# Patient Record
Sex: Female | Born: 1955 | Race: Black or African American | Hispanic: No | Marital: Married | Smoking: Never smoker
Health system: Southern US, Community
[De-identification: ages and names within clinical notes are randomized; demographics above are authoritative.]

## PROBLEM LIST (undated history)

## (undated) DIAGNOSIS — E079 Disorder of thyroid, unspecified: Secondary | ICD-10-CM

## (undated) DIAGNOSIS — I10 Essential (primary) hypertension: Secondary | ICD-10-CM

## (undated) DIAGNOSIS — E785 Hyperlipidemia, unspecified: Secondary | ICD-10-CM

## (undated) DIAGNOSIS — G459 Transient cerebral ischemic attack, unspecified: Secondary | ICD-10-CM

## (undated) HISTORY — DX: Transient cerebral ischemic attack, unspecified: G45.9

## (undated) HISTORY — DX: Essential (primary) hypertension: I10

## (undated) HISTORY — DX: Hyperlipidemia, unspecified: E78.5

## (undated) HISTORY — PX: OTHER SURGICAL HISTORY: SHX169

## (undated) HISTORY — DX: Disorder of thyroid, unspecified: E07.9

---

## 1998-11-27 ENCOUNTER — Other Ambulatory Visit: Admission: RE | Admit: 1998-11-27 | Discharge: 1998-11-27 | Payer: Self-pay | Admitting: Obstetrics and Gynecology

## 2009-05-15 ENCOUNTER — Ambulatory Visit: Payer: Self-pay | Admitting: Internal Medicine

## 2009-05-15 DIAGNOSIS — E119 Type 2 diabetes mellitus without complications: Secondary | ICD-10-CM | POA: Insufficient documentation

## 2009-05-15 DIAGNOSIS — I10 Essential (primary) hypertension: Secondary | ICD-10-CM | POA: Insufficient documentation

## 2009-05-15 DIAGNOSIS — E039 Hypothyroidism, unspecified: Secondary | ICD-10-CM | POA: Insufficient documentation

## 2009-05-15 DIAGNOSIS — E785 Hyperlipidemia, unspecified: Secondary | ICD-10-CM | POA: Insufficient documentation

## 2009-05-15 DIAGNOSIS — M810 Age-related osteoporosis without current pathological fracture: Secondary | ICD-10-CM | POA: Insufficient documentation

## 2009-05-15 LAB — HM DIABETES FOOT EXAM

## 2009-05-19 ENCOUNTER — Ambulatory Visit: Payer: Self-pay | Admitting: Internal Medicine

## 2009-05-19 LAB — CONVERTED CEMR LAB
BUN: 13 mg/dL (ref 6–23)
Basophils Absolute: 0 10*3/uL (ref 0.0–0.1)
Basophils Relative: 0 % (ref 0–1)
Bilirubin, Direct: 0.1 mg/dL (ref 0.0–0.3)
CO2: 23 meq/L (ref 19–32)
Chloride: 106 meq/L (ref 96–112)
Creatinine, Urine: 32.5 mg/dL
HCT: 38.7 % (ref 36.0–46.0)
Hemoglobin: 12 g/dL (ref 12.0–15.0)
Hgb A1c MFr Bld: 6.2 % — ABNORMAL HIGH (ref 4.6–6.1)
Indirect Bilirubin: 0.2 mg/dL (ref 0.0–0.9)
MCHC: 31 g/dL (ref 30.0–36.0)
MCV: 91.7 fL (ref 78.0–100.0)
Microalb Creat Ratio: 15.4 mg/g (ref 0.0–30.0)
Monocytes Relative: 12 % (ref 3–12)
Neutro Abs: 3.6 10*3/uL (ref 1.7–7.7)
Neutrophils Relative %: 49 % (ref 43–77)
TSH: 1.291 microintl units/mL (ref 0.350–4.500)
VLDL: 10 mg/dL (ref 0–40)
WBC: 7.3 10*3/uL (ref 4.0–10.5)

## 2009-05-20 ENCOUNTER — Encounter: Payer: Self-pay | Admitting: Internal Medicine

## 2009-06-06 ENCOUNTER — Telehealth: Payer: Self-pay | Admitting: Internal Medicine

## 2009-07-14 ENCOUNTER — Ambulatory Visit: Payer: Self-pay | Admitting: Internal Medicine

## 2009-07-14 DIAGNOSIS — L259 Unspecified contact dermatitis, unspecified cause: Secondary | ICD-10-CM | POA: Insufficient documentation

## 2009-10-14 ENCOUNTER — Ambulatory Visit: Payer: Self-pay | Admitting: Internal Medicine

## 2009-10-14 LAB — CONVERTED CEMR LAB
CO2: 25 meq/L (ref 19–32)
Calcium: 9.4 mg/dL (ref 8.4–10.5)

## 2009-10-22 ENCOUNTER — Ambulatory Visit: Payer: Self-pay | Admitting: Internal Medicine

## 2009-10-22 DIAGNOSIS — J029 Acute pharyngitis, unspecified: Secondary | ICD-10-CM | POA: Insufficient documentation

## 2009-10-23 ENCOUNTER — Telehealth: Payer: Self-pay | Admitting: Internal Medicine

## 2009-10-27 ENCOUNTER — Telehealth: Payer: Self-pay | Admitting: Internal Medicine

## 2009-10-28 ENCOUNTER — Ambulatory Visit (HOSPITAL_BASED_OUTPATIENT_CLINIC_OR_DEPARTMENT_OTHER): Admission: RE | Admit: 2009-10-28 | Discharge: 2009-10-28 | Payer: Self-pay | Admitting: Internal Medicine

## 2009-10-28 ENCOUNTER — Ambulatory Visit: Payer: Self-pay | Admitting: Internal Medicine

## 2009-10-28 ENCOUNTER — Ambulatory Visit: Payer: Self-pay | Admitting: Diagnostic Radiology

## 2009-10-28 DIAGNOSIS — R059 Cough, unspecified: Secondary | ICD-10-CM | POA: Insufficient documentation

## 2009-10-28 DIAGNOSIS — R05 Cough: Secondary | ICD-10-CM | POA: Insufficient documentation

## 2009-10-28 DIAGNOSIS — J189 Pneumonia, unspecified organism: Secondary | ICD-10-CM | POA: Insufficient documentation

## 2009-10-29 ENCOUNTER — Telehealth: Payer: Self-pay | Admitting: Internal Medicine

## 2009-10-31 ENCOUNTER — Telehealth: Payer: Self-pay | Admitting: Internal Medicine

## 2009-11-10 ENCOUNTER — Ambulatory Visit (HOSPITAL_BASED_OUTPATIENT_CLINIC_OR_DEPARTMENT_OTHER): Admission: RE | Admit: 2009-11-10 | Discharge: 2009-11-10 | Payer: Self-pay | Admitting: Internal Medicine

## 2009-11-10 ENCOUNTER — Ambulatory Visit: Payer: Self-pay | Admitting: Internal Medicine

## 2009-11-10 ENCOUNTER — Ambulatory Visit: Payer: Self-pay | Admitting: Diagnostic Radiology

## 2009-12-09 ENCOUNTER — Ambulatory Visit: Payer: Self-pay | Admitting: Internal Medicine

## 2010-04-01 ENCOUNTER — Encounter: Payer: Self-pay | Admitting: Internal Medicine

## 2010-04-01 ENCOUNTER — Ambulatory Visit: Payer: Self-pay | Admitting: Internal Medicine

## 2010-04-01 LAB — CONVERTED CEMR LAB
BUN: 10 mg/dL (ref 6–23)
CO2: 25 meq/L (ref 19–32)
Calcium: 9.2 mg/dL (ref 8.4–10.5)
Creatinine, Urine: 63.7 mg/dL
Microalb Creat Ratio: 7.8 mg/g (ref 0.0–30.0)
Microalb, Ur: 0.5 mg/dL (ref 0.00–1.89)
Potassium: 4.2 meq/L (ref 3.5–5.3)

## 2010-04-08 ENCOUNTER — Ambulatory Visit: Payer: Self-pay | Admitting: Internal Medicine

## 2010-04-13 ENCOUNTER — Encounter: Payer: Self-pay | Admitting: Internal Medicine

## 2010-06-11 ENCOUNTER — Encounter: Payer: Self-pay | Admitting: Internal Medicine

## 2010-06-12 ENCOUNTER — Telehealth: Payer: Self-pay | Admitting: Internal Medicine

## 2010-06-12 LAB — CONVERTED CEMR LAB
Bilirubin, Direct: 0.1 mg/dL (ref 0.0–0.3)
Cholesterol: 134 mg/dL (ref 0–200)
LDL Cholesterol: 65 mg/dL (ref 0–99)
Total Bilirubin: 0.4 mg/dL (ref 0.3–1.2)
Total CHOL/HDL Ratio: 2.4
Triglycerides: 61 mg/dL (ref <150)
VLDL: 12 mg/dL (ref 0–40)

## 2010-06-18 ENCOUNTER — Encounter: Payer: Self-pay | Admitting: Internal Medicine

## 2010-08-06 ENCOUNTER — Ambulatory Visit: Payer: Self-pay | Admitting: Internal Medicine

## 2010-08-06 DIAGNOSIS — R209 Unspecified disturbances of skin sensation: Secondary | ICD-10-CM | POA: Insufficient documentation

## 2010-08-06 LAB — CONVERTED CEMR LAB
Calcium: 9.5 mg/dL (ref 8.4–10.5)
Chloride: 106 meq/L (ref 96–112)
Creatinine, Ser: 0.65 mg/dL (ref 0.40–1.20)
TSH: 1.25 microintl units/mL (ref 0.350–4.500)

## 2010-08-07 ENCOUNTER — Encounter (INDEPENDENT_AMBULATORY_CARE_PROVIDER_SITE_OTHER): Payer: Self-pay | Admitting: *Deleted

## 2010-08-07 ENCOUNTER — Encounter: Payer: Self-pay | Admitting: Internal Medicine

## 2010-08-24 ENCOUNTER — Telehealth: Payer: Self-pay | Admitting: Internal Medicine

## 2010-09-15 NOTE — Miscellaneous (Signed)
Summary: Orders Update  Clinical Lists Changes  Orders: Added new Test order of T-Hepatic Function [phone]) - Signed Added new Test order of T-Lipid Profile [id number]) - Signed Added new Test order of T-TSH [phone]) - Signed

## 2010-09-15 NOTE — Assessment & Plan Note (Signed)
Summary: cpx/mhf pt out of town/dt   Vital Signs:  Patient profile:   55 year old female Weight:      233.50 pounds BMI:     37.82 O2 Sat:      99 % on Room air Temp:     98.0 degrees F oral Pulse rate:   70 / minute Pulse rhythm:   regular Resp:     18 per minute BP sitting:   112 / 80  (right arm) Cuff size:   large  Vitals Entered By: Glendell Docker CMA (April 08, 2010 8:37 AM)  O2 Flow:  Room air CC: CPX  Is Patient Diabetic? Yes Pain Assessment Patient in pain? no       Does patient need assistance? Functional Status Self care Ambulation Normal   Primary Care Provider:  Dondra Spry DO  CC:  CPX .  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   it is difficult to cook for herself  Preventive Screening-Counseling & Management  Alcohol-Tobacco     Alcohol drinks/day: <1     Smoking Status: never  Caffeine-Diet-Exercise     Caffeine use/day: None     Does Patient Exercise: no  Allergies: 1)  ! Sulfa 2)  Ramipril (Ramipril)  Past History:  Past Medical History: Diabetes mellitus, type II Hyperlipidemia  Hypertension Hypothyroidism  Osteoporosis       Past Surgical History: GYN - Dr. Rosalio Macadamia    Family History: Family History High cholesterol Family History Hypertension No fam hx of cancer      Social History: Occupation: Software engineer Married 27  years  1 son 53   1 daughter 67        husband - Interior and spatial designer of transportation in [place] Lives in [place] use/day:  None  Physical Exam  General:  alert, well-developed, and well-nourished.   Neck:  No deformities, masses, or tenderness noted.no carotid bruits.   Lungs:  normal respiratory effort, normal breath sounds, no crackles, and  no wheezes.   Heart:  normal rate, regular rhythm, and no gallop.   Abdomen:  soft, non-tender, normal bowel sounds, no hepatomegaly, and no splenomegaly.   Extremities:  No lower extremity edema  Neurologic:  cranial nerves II-XII intact and gait normal.   Psych:  normally interactive, good eye contact, not anxious appearing, and not depressed appearing.     Impression & Recommendations:  Problem # 1:  HEALTH MAINTENANCE EXAM (ICD-V70.0) Reviewed adult health maintenance protocols. PAP and Pelvic - follow up with her GYN Pt counseled on diet and exercise.  Orders: Mammogram (Screening) (Mammo)  Flu Vax: Fluvax Non-MCR (05/15/2009)   Pneumovax: Pneumovax (07/14/2009) Chol: 128 (05/19/2009)   HDL: 50 (05/19/2009)   LDL: 68 (05/19/2009)   TG: 49 (05/19/2009) TSH: 1.291 (05/19/2009)   HgbA1C: 6.4 (04/01/2010)     Complete Medication List: 1)  Synthroid 88 Mcg Tabs (Levothyroxine sodium) .... Take 1 tablet by mouth once a day 2)  Metformin Hcl 500 Mg Tabs (Metformin hcl) .... 2 tabs by mouth two times a day 3)  Multivitamins Tabs (Multiple vitamin) .... Take 1 tablet by mouth once a day 4)  Aspirin Low Dose 81 Mg Tabs (Aspirin) .... Take 1 tablet by  mouth once a day every evening 5)  Simvastatin 40 Mg Tabs (Simvastatin) .... Take 1 tab by mouth at bedtime 6)  Alendronate Sodium 70 Mg Tabs (Alendronate sodium) .... One by mouth qwkly 7)  Januvia 100 Mg Tabs (Sitagliptin phosphate) .... Take 1 tablet by mouth once a day every evening 8)  Diovan 160 Mg Tabs (Valsartan) .... One by mouth once daily 9)  Vitamin D3 1000 Unit Caps (Cholecalciferol) .... One by mouth once daily  Patient Instructions: 1)  Hepatic Panel prior to visit, ICD-9:  272.4 2)  Lipid Panel prior to visit, ICD-9: 272.4 3)  TSH prior to visit, ICD-9: 244.9 4)  Please schedule a follow-up appointment in 4 months. 5)  Please return for fasting lab work in October, 2011 Prescriptions: DIOVAN 160 MG TABS (VALSARTAN) one  by mouth once daily  #90 x 3   Entered and Authorized by:   D. Thomos Lemons DO   Signed by:   D. Thomos Lemons DO on 04/08/2010   Method used:   Electronically to        [facility]* (retail)             ,          Ph: [phone]       Fax: [phone]   RxID:   [id number] METFORMIN HCL 500 MG TABS (METFORMIN HCL) 2 tabs by mouth two times a day  #360 x 3   Entered and Authorized by:   D. Thomos Lemons DO   Signed by:   D. Thomos Lemons DO on 04/08/2010   Method used:   Print then Mail to Patient   RxID:   [id number] SYNTHROID 88 MCG TABS (LEVOTHYROXINE SODIUM) Take 1 tablet by mouth once a day  #90 x 3   Entered and Authorized by:   D. Thomos Lemons DO   Signed by:   D. Thomos Lemons DO on 04/08/2010   Method used:   Print then Mail to Patient   RxID:   [id number] SIMVASTATIN 40 MG TABS (SIMVASTATIN) Take 1 tab by mouth at bedtime  #90 Tablet x 3   Entered and Authorized by:   D. Thomos Lemons DO   Signed by:   D. Thomos Lemons DO on 04/08/2010   Method used:   Print then Mail to Patient   RxID:   [id number] JANUVIA 100 MG TABS (SITAGLIPTIN PHOSPHATE) Take 1 tablet by mouth once a day every evening  #90 x 3   Entered and Authorized by:   D. Thomos Lemons DO   Signed by:   D. Thomos Lemons DO on 04/08/2010   Method used:   Print then Mail to Patient   RxID:   [id number]   Current Allergies (reviewed today): ! SULFA RAMIPRIL (RAMIPRIL)       Prevention & Chronic Care Immunizations   Influenza vaccine: Fluvax Non-MCR  (05/15/2009)    Tetanus booster: Not documented    Pneumococcal vaccine: Pneumovax  (07/14/2009)  Colorectal Screening   Hemoccult: Not documented    Colonoscopy: Not documented  Other Screening   Pap smear: Not documented    Mammogram: Not documented   Smoking status: never  (04/08/2010)  Diabetes Mellitus   HgbA1C: 6.4  (04/01/2010)    Eye exam: normal  (02/24/2009)   Eye exam due: 02/2010    Foot exam: yes  (05/15/2009)   High  risk foot: Not documented   Foot care education: Not documented    Urine microalbumin/creatinine ratio: 7.8  (  04/01/2010)  Lipids   Total Cholesterol: 128  (05/19/2009)   LDL: 68  (05/19/2009)   LDL Direct: Not documented   HDL: 50  (05/19/2009)   Triglycerides: 49  (05/19/2009)    SGOT (AST): 24  (05/19/2009)   SGPT (ALT): 16  (05/19/2009)   Alkaline phosphatase: 70  (05/19/2009)   Total bilirubin: 0.3  (05/19/2009)  Hypertension   Last Blood Pressure: 112 / 80  (04/08/2010)   Serum creatinine: 0.67  (04/01/2010)   Serum potassium 4.2  (04/01/2010)  Self-Management Support :    Diabetes self-management support: Not documented    Hypertension self-management support: Not documented    Lipid self-management support: Not documented

## 2010-09-15 NOTE — Miscellaneous (Signed)
Summary: BONE DENSITY  Clinical Lists Changes  Orders: Added new Test order of T-Bone Densitometry (77080) - Signed Added new Test order of T-Lumbar Vertebral Assessment (77082) - Signed 

## 2010-09-15 NOTE — Assessment & Plan Note (Signed)
Summary: ST  /?STREP/HEA   Vital Signs:  Patient profile:   55 year old female Weight:      228.50 pounds O2 Sat:      97 % on Room air Temp:     98.3 degrees F oral Pulse rate:   88 / minute Pulse rhythm:   regular Resp:     18 per minute BP sitting:   150 / 90  (right arm) Cuff size:   large  Vitals Entered By: Glendell Docker CMA (October 22, 2009 10:39 AM)  O2 Flow:  Room air CC: Rm 2- sore throat, URI symptoms Is Patient Diabetic? Yes Comments c/o sore throat, pain with swallowing, no temp, dry cough sudden onset yesterday   Primary Care Provider:  DThomos Lemons DO  CC:  Rm 2- sore throat and URI symptoms.  History of Present Illness:  URI Symptoms      This is a 55 year old woman who presents with URI symptoms.  The patient reports sore throat and dry cough.  The patient denies fever.  The patient denies muscle aches and severe fatigue.  onset - 24-48 hrs  mother from [place] living with her temporarily - rehab after surgery  Allergies: 1)  ! 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   Neck:  No deformities, masses, or tenderness noted.no carotid bruits.   Lungs:  normal respiratory effort, normal breath sounds, no crackles, and no wheezes.   Heart:  normal rate, regular rhythm, and no gallop.     Impression & Recommendations:  Problem # 1:  PHARYNGITIS, ACUTE (ICD-462) 55 y/o with acute pharyngitis x 24 -48 hrs. rapid strep is negative.  she likely has viral URI.  continue salt water gargles.  we discussed symptoms of secondary sinusitis or bronchitis.  rx for ceftin provided with intstructions  to start if symptoms go beyond 7-10 days, fever, or purulent nasal discharge.  Her updated medication list for this problem includes:    Aspirin Low Dose 81 Mg Tabs (Aspirin) .Marland Kitchen... Take 1 tablet by mouth once a day every evening    Cefuroxime Axetil 500 Mg Tabs (Cefuroxime axetil) ..... One by mouth two times a day  Complete Medication List: 1)  Synthroid 88 Mcg Tabs (Levothyroxine sodium) .... Take 1 tablet by mouth once a day 2)  Calcium 500 Mg Tabs (Calcium carbonate) .... Take 1 tablet by mouth two times a day 3)  Metformin Hcl 500 Mg Tabs (Metformin hcl) .Marland Kitchen.. 1 by mouth  every morning and 2 by mouth at lunch and 2 by mouth at dinner (2500) 4)  Multivitamins Tabs (Multiple vitamin) .... Take 1 tablet by mouth once a day 5)  Aspirin Low Dose 81 Mg Tabs (Aspirin) .... Take 1 tablet by mouth once a day every evening 6)  Simvastatin 40 Mg Tabs (Simvastatin) .... Take 1 tab by mouth at bedtime 7)  Ramipril 10 Mg Caps (Ramipril) .... Take 1 capsule by mouth at bedtime 8)  Actonel 150 Mg Tabs (Risedronate sodium) .... One tablet by mouth once a month 9)  Januvia 100 Mg Tabs (Sitagliptin phosphate) .... Take 1 tablet  by mouth once a day every evening 10)  Triamcinolone Acetonide 0.1 % Crea (Triamcinolone acetonide) .... Apply two times a day as directed 11)  Cefuroxime Axetil 500 Mg Tabs (Cefuroxime axetil) .... One by mouth two times a day 12)  Tessalon Perles 100 Mg Caps (Benzonatate) .... One by mouth three times a day as needed for cough  Other Orders: Rapid Strep (29562)  Patient Instructions: 1)  Call our office if your symptoms do not  improve or gets worse. 2)  Please schedule a follow-up appointment in 4 months. 3)  BMP prior to visit, ICD-9:  250.00 4)  HbgA1C prior to visit, ICD-9: 250.00 5)  Please return for lab work one (1) week before your next appointment.  Prescriptions: TESSALON PERLES 100 MG CAPS (BENZONATATE) one by mouth three times a day as needed for cough  #21 x 0    Entered and Authorized by:   D. Thomos Lemons DO   Signed by:   D. Thomos Lemons DO on 10/22/2009   Method used:   Print then Give to Patient   RxID:   [id number] CEFUROXIME AXETIL 500 MG TABS (CEFUROXIME AXETIL) one by mouth two times a day  #14 x 0   Entered and Authorized by:   D. Thomos Lemons DO   Signed by:   D. Thomos Lemons DO on 10/22/2009   Method used:   Print then Give to Patient   RxID:   [id number] ACTONEL 150 MG TABS (RISEDRONATE SODIUM) one tablet by mouth once a month  #3 x 3   Entered by:   Glendell Docker CMA   Authorized by:   D. Thomos Lemons DO   Signed by:   Glendell Docker CMA on 10/22/2009   Method used:   Electronically to        [facility]* (mail-order)             ,          Ph: [phone]       Fax: [phone]   RxID:   [id number]   Current Allergies (reviewed today): ! SULFA      Laboratory Results    Other Tests  Rapid Strep: negative

## 2010-09-15 NOTE — Assessment & Plan Note (Signed)
Summary: UNRESOLVED COUGH/DK, resched- jr   Vital Signs:  Patient profile:   55 year old [place] O2 Sat:      98 % on Room air Temp:     98.8 degrees F Pulse rate:   95 / minute Pulse rhythm:   regular Resp:     20 per minute BP sitting:   124 / 80  (right arm) Cuff size:   large  Vitals Entered By: Glendell Docker CMA (October 28, 2009 10:53 AM)  O2 Flow:  Room air CC: Rm -3 unresolved Cough   Primary Care Provider:  DThomos Lemons DO  CC:  Rm -3 unresolved Cough.  History of Present Illness: [place] c/o persistent cough.  cough is prod of green sputum.  some chest tightness she took ceftin as directed.  Allergies: 1)  ! 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   Lungs:  normal respiratory effort. coarse breath sounds right lung field  Heart:  normal rate, regular rhythm, and no gallop.   Extremities:  No lower extremity edema    Impression & Recommendations:  Problem # 1:  PNEUMONIA (ICD-486) 55 y/o with refractory cough.  she failed ceftin.  CXR shows Infiltrative densities are seen in the right perihilar region and right midlung zone with some nodularity on the PA image.  On the lateral image there is seen to be element of atelectasis involving the right perihilar region and the anterior inferior aspect of theright upper lobe.   This most likely reflects bronchopneumonia.  switch to avelox.  Patient advised to call office if symptoms persist or worsen.  f/u CXR in 1 month  The following medications were removed from the medication list:    Cefuroxime Axetil 500 Mg Tabs  (Cefuroxime axetil) ..... One by mouth two times a day Her updated medication list for this problem includes:    Avelox 400 Mg Tabs (Moxifloxacin hcl) ..... One by mouth qd  Complete Medication List: 1)  Synthroid 88 Mcg Tabs (Levothyroxine sodium) .... Take 1 tablet by mouth once a day 2)  Calcium 500 Mg Tabs (Calcium carbonate) .... Take 1 tablet by mouth two times a day 3)  Metformin Hcl 500 Mg Tabs (Metformin hcl) .Marland Kitchen.. 1 by mouth  every morning and 2 by mouth at lunch and 2 by mouth at dinner (2500) 4)  Multivitamins Tabs (Multiple vitamin) .... Take 1 tablet by mouth once a day 5)  Aspirin Low Dose 81 Mg Tabs (Aspirin) .... Take 1 tablet by mouth once a day every evening 6)  Simvastatin 40 Mg Tabs (Simvastatin) .... Take 1 tab by mouth at bedtime 7)  Ramipril 10 Mg Caps (Ramipril) .... Take 1 capsule by mouth at bedtime 8)  Actonel 150 Mg Tabs (Risedronate sodium) .... One tablet by mouth once a month 9)  Januvia 100 Mg Tabs (Sitagliptin phosphate) .... Take 1 tablet by mouth once a day every evening 10)  Triamcinolone Acetonide 0.1 % Crea (Triamcinolone acetonide) .... Apply two times a day as directed 11)  Tessalon Perles 100 Mg Caps (Benzonatate) .... One by mouth three times  a day as needed for cough 12)  Avelox 400 Mg Tabs (Moxifloxacin hcl) .... One by mouth qd 13)  Hydrocodone-homatropine 5-1.5 Mg/28ml Syrp (Hydrocodone-homatropine) .... 5 ml at bedtime prn  Other Orders: T-2 View CXR, Same Day (71020.5TC)  Patient Instructions: 1)  Please schedule a follow-up appointment in 1 month. Prescriptions: HYDROCODONE-HOMATROPINE 5-1.5 MG/5ML SYRP (HYDROCODONE-HOMATROPINE) 5 ml at bedtime prn  #60 ml x 0   Entered and Authorized by:   D. Thomos Lemons DO   Signed by:   D. Thomos Lemons DO on 10/28/2009   Method used:   Print then Give to Patient   RxID:   [id number] AVELOX 400 MG TABS (MOXIFLOXACIN HCL) one by mouth qd  #10 x 0   Entered and Authorized by:   D. Thomos Lemons DO    Signed by:   D. Thomos Lemons DO on 10/28/2009   Method used:   Electronically to        [facility]  [address] [phone]* (retail)       [address]       [place], [place]  [address]       Ph: [phone] or [phone]       Fax: [phone]   RxID:   [id number]   Current Allergies (reviewed today): ! SULFA

## 2010-09-15 NOTE — Assessment & Plan Note (Signed)
Summary: PNEUMONIA/MHF   Vital Signs:  Patient profile:   55 year old female Height:      66 inches Weight:      226.50 pounds BMI:     36.69 O2 Sat:      100 % on Room air Temp:     98.1 degrees F oral Pulse rate:   78 / minute Pulse rhythm:   regular Resp:     18 per minute BP sitting:   140 / 80  (right arm) Cuff size:   large  Vitals Entered By: Glendell Docker CMA (November 10, 2009 3:14 PM)  O2 Flow:  Room air CC: Rm 2- unresolved Cough Comments c/o unresolved coughing spasms with vomiting, headache x 2 days , green in color    Primary Care Provider:  Dondra Spry DO  CC:  Rm 2- unresolved Cough.  History of Present Illness: Pt seen middle of March for persistent / refractory cough.   she was diagnosed with right sided pna.  she completed course of avelox.  she felt better.  mild dry cough.    yest she again developed significant cough to the point of throwing up mucus varies from clear to greenish in color no sinus congestion no fever chills,  no SOB  Allergies: 1)  ! 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   Lungs:  normal respiratory effort, normal breath sounds, no crackles, and no wheezes.   Heart:  normal rate, regular rhythm, and no gallop.     Impression & Recommendations:  Problem # 1:  PNEUMONIA (ICD-486) Assessment Improved Pt with refractory cough.  CXR shows clearing of pna.  I suspect cough multifactorial -  post nasal gtt/rhinosinusitis and ACE cough The following medications were removed from the medication list:    Avelox 400 Mg Tabs  (Moxifloxacin hcl) ..... One by mouth qd Her updated medication list for this problem includes:    Cefuroxime Axetil 500 Mg Tabs (Cefuroxime axetil) ..... One by mouth two times a day  Orders: T-2 View CXR (71020TC)  Problem # 2:  HYPERTENSION (ICD-401.9) Pt with chronic cough.  DC ACE.  change to diovan.  The following medications were removed from the medication list:    Ramipril 10 Mg Caps (Ramipril) .Marland Kitchen... Take 1 capsule by mouth at bedtime Her updated medication list for this problem includes:    Diovan 160 Mg Tabs (Valsartan) ..... One by mouth once daily  Problem # 3:  OSTEOPOROSIS (ICD-733.00) Change to alendronate.   actonel cost prohibitive.  no GI side effect wtih oral bisphosphonates.   she is due to f/u DEXA - arrange at [facility] Her updated medication list for this problem includes:    Alendronate Sodium 70 Mg Tabs (Alendronate sodium) ..... One by mouth qwkly  Problem # 4:  DIABETES MELLITUS, TYPE II (ICD-250.00) simplify metformin regimen.   she is motivated to start exercise program.  her goal is to try to get off oral agents if possible   The following medications were removed from the medication list:    Ramipril 10  Mg Caps (Ramipril) .Marland Kitchen... Take 1 capsule by mouth at bedtime Her updated medication list for this problem includes:    Metformin Hcl 500 Mg Tabs (Metformin hcl) .Marland Kitchen... 2 tabs by mouth two times a day    Aspirin Low Dose 81 Mg Tabs (Aspirin) .Marland Kitchen... Take 1 tablet by mouth once a day every evening    Januvia 100 Mg Tabs (Sitagliptin phosphate) .Marland Kitchen... Take 1 tablet by mouth once a day every evening    Diovan 160 Mg Tabs (Valsartan) ..... One by mouth once daily  Labs Reviewed: Creat: 0.62 (10/14/2009)     Last Eye Exam: normal (02/24/2009) Reviewed HgBA1c results: 6.7 (10/14/2009)  6.2 (05/19/2009)  Complete Medication List: 1)  Synthroid 88 Mcg Tabs (Levothyroxine sodium) .... Take 1 tablet by mouth once a day 2)  Calcium 500 Mg Tabs (Calcium carbonate)  .... Take 1 tablet by mouth two times a day 3)  Metformin Hcl 500 Mg Tabs (Metformin hcl) .... 2 tabs by mouth two times a day 4)  Multivitamins Tabs (Multiple vitamin) .... Take 1 tablet by mouth once a day 5)  Aspirin Low Dose 81 Mg Tabs (Aspirin) .... Take 1 tablet by mouth once a day every evening 6)  Simvastatin 40 Mg Tabs (Simvastatin) .... Take 1 tab by mouth at bedtime 7)  Alendronate Sodium 70 Mg Tabs (Alendronate sodium) .... One by mouth qwkly 8)  Januvia 100 Mg Tabs (Sitagliptin phosphate) .... Take 1 tablet by mouth once a day every evening 9)  Triamcinolone Acetonide 0.1 % Crea (Triamcinolone acetonide) .... Apply two times a day as directed 10)  Hydrocodone-homatropine 5-1.5 Mg/48ml Syrp (Hydrocodone-homatropine) .... 5 ml at bedtime prn 11)  Diovan 160 Mg Tabs (Valsartan) .... One by mouth once daily 12)  Cefuroxime Axetil 500 Mg Tabs (Cefuroxime axetil) .... One by mouth two times a day  Patient Instructions: 1)  Stop ramipril 2)  Take cefuroxime as directed. 3)  Keep your next follow up appointment on 4/15 Prescriptions: ALENDRONATE SODIUM 70 MG TABS (ALENDRONATE SODIUM) one by mouth qwkly  #12 x 3   Entered and Authorized by:   D. Thomos Lemons DO   Signed by:   D. Thomos Lemons DO on 11/10/2009   Method used:   Electronically to        [facility]* (mail-order)             ,          Ph: [phone]       Fax: [phone]   RxID:   [id number] CEFUROXIME AXETIL 500 MG TABS (CEFUROXIME AXETIL) one by mouth two times a day  #20 x 0   Entered and Authorized by:   D. Thomos Lemons DO   Signed by:   D. Thomos Lemons DO on 11/10/2009   Method used:   [facility]        [facility]  [address] [phone][facility] ([facility]ail)       [address]       [place], [place]  [address]       Ph: [phone] or [phone]       Fax: [phone]   RxID:   [id number] DIOVAN 160 MG TABS (VALSARTAN) one by mouth once daily  #30 x 2   Entered and Authorized by:   D.  Thomos Lemons DO   Signed by:   D. Thomos Lemons DO on 11/10/2009   Method used:   [facility]  [facility]  [address] [phone]* (retail)       [address]       [place], [place]  [address]       Ph: [phone] or [phone]       Fax: [phone]   RxID:   [id number]

## 2010-09-15 NOTE — Progress Notes (Signed)
Summary: Medication Change and Status update  Phone Note From Pharmacy   Summary of Call: Call [facility] [phone] and give ref# [phone] to give as prior authorization for Alendronate to stay the same Initial call taken by: Michaelle Copas,  October 27, 2009 11:01 AM  Follow-up for Phone Call        call returned to [facility] with Toniann Fail, preferred drug med list review for determination for coverage review, Alendronate. Follow-up by: Glendell Docker CMA,  October 27, 2009 2:21 PM  Additional Follow-up for Phone Call Additional follow up Details #1::        call was placed to patient to clarif what medication she would like to have. She states that she is willing to switch to Alendronate as long as it is along the same lines. However she also wanted Dr Artist Pais to know she was seen last for sore throat,and cough. She states she is not feeling any better. The Cough is not any better and the cough medicatiion is not helping. She states she was in the office this morning and spoke with someone about her symptoms,and was aswaiting to hear back. Additional Follow-up by: Glendell Docker CMA,  October 27, 2009 5:09 PM    Additional Follow-up for Phone Call Additional follow up Details #2::    patient advised per Dr Artist Pais that she will need to return for further evaluation. She has verbalized understanding and agrees. Appointment has been scheduled for 3/15 @4pm . Medicaiton changed to be discussed at office visit Follow-up by: Glendell Docker CMA,  October 27, 2009 5:19 PM

## 2010-09-15 NOTE — Progress Notes (Signed)
Summary: PLEAE SEND RX TO PHARMACY   Phone Note Call from Patient   Caller: Patient Call For: Virginia Travis  Summary of Call: WAS IN YESTERDAY GOT SAMPLES AND A WRITTEN RX  DID NOT REALIZE THE RX WAS FOR A DIFFERENT MED THAN THE SAMPLES.  SHE NEEDS THE RX FOR HYDROCODONE HOMOTROPINE  [facility] [phone] SO SOMEONE ELSE CAN PICK IT UP  Initial call taken by: [name],  October 29, 2009 9:42 AM  Follow-up for Phone Call        patient informed rx is controlled and she would need to take written rx to pharmacy to have it filled.  Follow-up by: Glendell Docker CMA,  October 29, 2009 9:51 AM

## 2010-09-15 NOTE — Progress Notes (Signed)
Summary: BACK TO WORK WHEN?  Phone Note Call from Patient Call back at [facility] Phone [phone]   Caller: PATIENT  Call For: YOO  Summary of Call: WANTS TO KNOW  WHAT WILL BE REQUIRED FOR HER TO GO BACK TO WORK  Initial call taken by: [name],  October 31, 2009 9:05 AM  Follow-up for Phone Call        If cough is getting better, she can go back to work on Monday Follow-up by: D. Thomos Lemons DO,  October 31, 2009 12:43 PM  Additional Follow-up for Phone Call Additional follow up Details #1::        patient advised per Dr Artist Pais instructions. She states she will go back and if she does not fell any better once she gets back, she will call the office  Additional Follow-up by: Glendell Docker CMA,  October 31, 2009 3:30 PM

## 2010-09-15 NOTE — Assessment & Plan Note (Signed)
Summary: rsc with pt from bump/mhf   Vital Signs:  Patient profile:   55 year old female Height:      66 inches Weight:      227.50 pounds BMI:     36.85 O2 Sat:      99 % on Room air Temp:     98.0 degrees F oral Pulse rate:   84 / minute Pulse rhythm:   regular Resp:     18 per minute BP sitting:   118 / 80  (right arm) Cuff size:   large  Vitals Entered By: Glendell Docker CMA (December 09, 2009 4:01 PM)  O2 Flow:  Room air CC: Rm 3 - Follow up on medication change Comments medication change tolerate well, no problems   Primary Care Provider:  DThomos Lemons DO  CC:  Rm 3 - Follow up on medication change.  History of Present Illness:  Hypertension Follow-Up      This is a 55 year old woman who presents for Hypertension follow-up.  The patient denies lightheadedness.  The patient denies the following associated symptoms: chest pain.  Compliance with medications (by patient report) has been near 100%.  The patient reports that dietary compliance has been fair.  cough resolved after change ACE to ARB  DM II - wt stable  Allergies: 1)  ! Sulfa 2)  Ramipril (Ramipril)  Past History:  Past Medical History: Diabetes mellitus, type II Hyperlipidemia  Hypertension Hypothyroidism  Osteoporosis      Family History: Family History High cholesterol Family History Hypertension No fam hx of cancer      Social History: Occupation: Programmer, systems @ [employer] Married 27  years  1 son 32   1 daughter 41         Physical Exam  General:  alert, well-developed, and well-nourished.   Neck:  No deformities, masses, or tenderness noted.no carotid bruits.   Lungs:  normal respiratory effort, normal breath sounds, no crackles, and no wheezes.   Heart:  normal rate, regular rhythm, and no gallop.   Extremities:  No lower extremity edema  Psych:  normally interactive and good eye contact.     Impression & Recommendations:  Problem # 1:  HYPERTENSION (ICD-401.9) cough  resolved after change ACE to ARB. BP well controlled.  Her updated medication list for this problem includes:    Diovan 160 Mg Tabs (Valsartan) ..... One by mouth once daily  Future Orders: T-Basic Metabolic Panel [id number]) ... 03/23/2010  BP today: 118/80 Prior BP: 140/80 (11/10/2009)  Labs Reviewed: K+: 4.4 (10/14/2009) Creat: : 0.62 (10/14/2009)   Chol: 128 (05/19/2009)   HDL: 50 (05/19/2009)   LDL: 68 (05/19/2009)   TG: 49 (05/19/2009)  Problem # 2:  DIABETES MELLITUS, TYPE II (ICD-250.00) Assessment: Comment Only continue meds and lifestyle changes.  carb counting reviewed  Her updated medication list for this problem includes:    Metformin Hcl 500 Mg Tabs (Metformin hcl) .Marland Kitchen... 2 tabs by mouth two times a day    Aspirin Low Dose 81 Mg Tabs (Aspirin) .Marland Kitchen... Take 1 tablet by mouth once a day every evening    Januvia 100 Mg Tabs (Sitagliptin phosphate) .Marland Kitchen... Take 1 tablet by mouth once a day every evening    Diovan 160 Mg Tabs (Valsartan) ..... One by mouth once daily  Future Orders: T- Hemoglobin A1C ([id number]) ... 03/23/2010 T-Urine Microalbumin w/creat. ratio [id number]) ... 03/23/2010  Labs Reviewed: Creat: 0.62 (10/14/2009)     Last Eye Exam: normal (  02/24/2009) Reviewed HgBA1c results: 6.7 (10/14/2009)  6.2 (05/19/2009)  Complete Medication List: 1)  Synthroid 88 Mcg Tabs (Levothyroxine sodium) .... Take 1 tablet by mouth once a day 2)  Metformin Hcl 500 Mg Tabs (Metformin hcl) .... 2 tabs by mouth two times a day 3)  Multivitamins Tabs (Multiple vitamin) .... Take 1 tablet by mouth once a day 4)  Aspirin Low Dose 81 Mg Tabs (Aspirin) .... Take 1 tablet by mouth once a day every evening 5)  Simvastatin 40 Mg Tabs (Simvastatin) .... Take 1 tab by mouth at bedtime 6)  Alendronate Sodium 70 Mg Tabs (Alendronate sodium) .... One by mouth qwkly 7)  Januvia 100 Mg Tabs (Sitagliptin phosphate) .... Take 1 tablet by mouth once a day every evening 8)  Diovan  160 Mg Tabs (Valsartan) .... One by mouth once daily 9)  Vitamin D3 1000 Unit Caps (Cholecalciferol) .... One by mouth once daily  Patient Instructions: 1)  Keep your next appt in August 2)  BMP prior to visit, ICD-9: 401.9 3)  HbgA1C prior to visit, ICD-9: 250.00 4)  Urine Microalbumin prior to visit, ICD-9:  250.00 5)  Please return for lab work one (1) week before your next appointment.  Prescriptions: DIOVAN 160 MG TABS (VALSARTAN) one by mouth once daily  #90 x 3   Entered and Authorized by:   D. Thomos Lemons DO   Signed by:   D. Thomos Lemons DO on 12/09/2009   Method used:   Electronically to        [facility]* (mail-order)             ,          Ph: [phone]       Fax: [phone]   RxID:   [id number]   Current Allergies (reviewed today): ! SULFA RAMIPRIL (RAMIPRIL)

## 2010-09-15 NOTE — Letter (Signed)
   [place] at [facility] [address] [place], [place]  [address]  [place] Phone: [phone]      April 13, 2010   [facility] [address] [place], [place] [address]  RE:  LAB RESULTS  Dear  Ms. Caton,  The following is an interpretation of your most recent lab tests.  Please take note of any instructions provided or changes to medications that have resulted from your lab work.      Bone density scan - normal       Sincerely Yours,    Dr. Thomos Lemons

## 2010-09-15 NOTE — Progress Notes (Signed)
Summary: would like referral   Phone Note Call from Patient   Caller: Patient Call For: yoo Summary of Call: she would like a referral to a nutritionist.  Dr Myrle Sheng @ [facility] [phone] Initial call taken by: [name],  June 12, 2010 8:34 AM  Follow-up for Phone Call        Referral order fax to  Dr Myrle Sheng   [phone]  she will schedule pt and contact  her Follow-up by: Darral Dash,  June 12, 2010 3:26 PM

## 2010-09-15 NOTE — Letter (Signed)
    at [facility] [address] [place], [place]  [address]  [place] Phone: [phone]      June 18, 2010   [facility] [address] [place], [place] [address]  RE:  LAB RESULTS  Dear  Ms. Larue,  The following is an interpretation of your most recent lab tests.  Please take note of any instructions provided or changes to medications that have resulted from your lab work.  LIVER FUNCTION TESTS:  Good - no changes needed  LIPID PANEL:  Good - no changes needed Triglyceride: 61   Cholesterol: 134   LDL: 65   HDL: 57   Chol/HDL%:  2.4 Ratio         Sincerely Yours,    Dr. Thomos Lemons  Appended Document:  mailed

## 2010-09-15 NOTE — Progress Notes (Signed)
Summary: Alendronate request  Phone Note Outgoing Call   Summary of Call: Received request from [facility] to change patient's Actonel to a covered alternative such as alendronate sodium. Please advise if this is ok and directions? Initial call taken by: Mervin Kung CMA,  October 23, 2009 12:26 PM  Follow-up for Phone Call        notify pt - we can discuss this at next ov Follow-up by: D. Thomos Lemons DO,  October 23, 2009 4:20 PM  Additional Follow-up for Phone Call Additional follow up Details #1::        attmpted to call pt. twice today and there is no answer, pt. has a appointment for today  Additional Follow-up by: Michaelle Copas,  October 24, 2009 1:50 PM    Additional Follow-up for Phone Call Additional follow up Details #2::    pt. called back and states that she wants to stay on same medicine. Follow-up by: Michaelle Copas,  October 24, 2009 4:42 PM  Additional Follow-up for Phone Call Additional follow up Details #3:: Details for Additional Follow-up Action Taken: patient came in and said if the aldroneate sodium is the same medicine that would be ok because she does not want to pay more for the med.  if it is the same a generic will be fine.  [phone] [name]  October 27, 2009 8:18 AM    will address at 10/28/2009 office visit Glendell Docker CMA  October 27, 2009 5:20 PM

## 2010-09-15 NOTE — Letter (Signed)
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 [address]   [place], [place] [address]   Phone: [phone]  Fax: [phone]      October 28, 2009     Employee:  Virginia Travis    To Whom It May Concern:   For Medical reasons, please excuse the above named employee from work for the following dates:  Start:   10/28/2009  End:   11/02/2009  She may resume her normal work schedule 11/03/2009.If you need additional information, please feel free to contact our office.          Sincerely,    Glendell Docker CMA Dr. Thomos Lemons

## 2010-09-17 NOTE — Letter (Signed)
    at [facility] [address] [place], [place]  [address]  [place] Phone: [phone]      August 07, 2010   [facility] [address] [place], [place] [address]  RE:  LAB RESULTS  Dear  Ms. [place]ler,  The following is an interpretation of your most recent lab tests.  Please take note of any instructions provided or changes to medications that have resulted from your lab work.  ELECTROLYTES:  Good - no changes needed  KIDNEY FUNCTION TESTS:  Good - no changes needed   THYROID STUDIES:  Thyroid studies normal TSH: 1.250     DIABETIC STUDIES:  Good - no changes needed Blood Glucose: 89   HgbA1C: 6.2   Microalbumin/Creatinine Ratio: 4.5          Sincerely Yours,    Dr. Thomos Lemons  Appended Document:  mailed

## 2010-09-17 NOTE — Assessment & Plan Note (Signed)
Summary: 4 MONTH FOLLOW UP/MHF   Vital Signs:  Patient profile:   55 year old female Height:      66 inches Weight:      225.50 pounds BMI:     36.53 O2 Sat:      98 % on Room air Temp:     98.2 degrees F oral Pulse rate:   82 / minute Resp:     20 per minute BP sitting:   130 / 80  (right arm) Cuff size:   large  Vitals Entered By: Glendell Docker CMA (August 06, 2010 8:08 AM)  O2 Flow:  Room air CC: 4 Month follow up  Is Patient Diabetic? Yes Did you bring your meter with you today? No Pain Assessment Patient in pain? no       Does patient need assistance? Functional Status Cook/clean Comments dry patches on body unresolved, left pinky toe-tingling-unresolved for the past 2 years, refill to [facility]- August rx's have not been submitted, use clotrimazole in the past with no relief, triamcinolone cream  questions, trouble sleeping, nausea & diarrhea related to stress, discuss supplements, has not been taking the mutlivitamins, vitamin D or Calcium, not checking blood sugars on a regular basis, given a rx for vistaril 25 mg  but she does not intend to have it filled    Primary Care [place] Seever:  D. Thomos Lemons DO  CC:  4 Month follow up .  History of Present Illness: 55 y/o AA female for follow up pt had allergy / nutrition referral  (goal of wt loss) pt completed allergy testing  increased stress at work she quit her job  blood sugar 113 she went to urgent care - she was experiencing diarrhea and stomach cramps prescribed vistaril - she has not taken yet  daugther in trouble - DUI   Allergies: 1)  ! Sulfa 2)  Ramipril (Ramipril)  Past History:  Past Medical History: Diabetes mellitus, type II Hyperlipidemia  Hypertension Hypothyroidism  Osteoporosis        Social History: Occupation: Software engineer  Married 27  years  1 son 56   1 daughter 84        husband - Interior and spatial designer of transportation [place]  Review of Systems  The patient denies  weight gain and chest pain.    Physical Exam  General:  alert, well-developed, and well-nourished.   Lungs:  normal respiratory effort and normal breath sounds.   Heart:  normal rate, regular rhythm, no gallop, and no rub.   Extremities:  callus under 5th digit - left foot  Neurologic:  cranial nerves II-XII intact and gait normal.   Skin:  dry patches on abdomin Psych:  normally interactive, good eye contact, not anxious appearing, and not depressed appearing.     Impression & Recommendations:  Problem # 1:  PARESTHESIA (ICD-782.0) pt with intermittent tingling sensation bottom of 5 th digit of left foot.  she has callus which I suspect is contritbuting. no signs of diabetic polyneuropathy refer to Dr. Ralene Cork - she may benefit from orthotics  Orders: Podiatry Referral (Podiatry)  Problem # 2:  ECZEMA (ICD-692.9) pt with dry patches on abd.   continue topical steroids as directed  Problem # 3:  HYPERTENSION (ICD-401.9)  Her updated medication list for this problem includes:    Diovan 160 Mg Tabs (Valsartan) ..... One by mouth once daily  BP today: 130/80 Prior BP: 112/80 (04/08/2010)  Labs Reviewed: K+: 4.2 (04/01/2010) Creat: : 0.67 (  04/01/2010)   Chol: 134 (06/12/2010)   HDL: 57 (06/12/2010)   LDL: 65 (06/12/2010)   TG: 61 (06/12/2010)  Orders: T-Basic Metabolic Panel [phone])  Problem # 4:  DIABETES MELLITUS, TYPE II (ICD-250.00)  The following medications were removed from the medication list:    Metformin Hcl 500 Mg Tabs (Metformin hcl) .Marland Kitchen... 2 tabs by mouth two times a day    Januvia 100 Mg Tabs (Sitagliptin phosphate) .Marland Kitchen... Take 1 tablet by mouth once a day every evening Her updated medication list for this problem includes:    Aspirin Low Dose 81 Mg Tabs (Aspirin) .Marland Kitchen... Take 1 tablet by mouth once a day every evening    Diovan 160 Mg Tabs (Valsartan) ..... One by mouth once daily    Janumet 50-500 Mg Tabs (Sitagliptin-metformin hcl) ..... One by mouth  two times a day  Orders: T- Hemoglobin A1C ([id number]) T-Urine Microalbumin w/creat. ratio [phone])  Complete Medication List: 1)  Synthroid 88 Mcg Tabs (Levothyroxine sodium) .... Take 1 tablet by mouth once a day 2)  Multivitamins Tabs (Multiple vitamin) .... Take 1 tablet by mouth once a day 3)  Aspirin Low Dose 81 Mg Tabs (Aspirin) .... Take 1 tablet by mouth once a day every evening 4)  Simvastatin 40 Mg Tabs (Simvastatin) .... Take 1 tab by mouth at bedtime 5)  Alendronate Sodium 70 Mg Tabs (Alendronate sodium) .... One by mouth qwkly 6)  Diovan 160 Mg Tabs (Valsartan) .... One by mouth once daily 7)  Vitamin D3 1000 Unit Caps (Cholecalciferol) .... One by mouth once daily 8)  Janumet 50-500 Mg Tabs (Sitagliptin-metformin hcl) .... One by mouth two times a day  Other Orders: T-TSH ([id number])  Patient Instructions: 1)  Please schedule a follow-up appointment in 4 months. 2)  BMP prior to visit, ICD-9: 401.9 3)  Hepatic Panel prior to visit, ICD-9: 272.4 4)  Lipid Panel prior to visit, ICD-9: 272.4 5)  TSH prior to visit, ICD-9: 244.9 6)  HbgA1C prior to visit, ICD-9: 250.00 7)  Please return for lab work one (1) week before your next appointment.  Prescriptions: DIOVAN 160 MG TABS (VALSARTAN) one by mouth once daily  #90 x 1   Entered and Authorized by:   D. Thomos Lemons DO   Signed by:   D. Thomos Lemons DO on 08/06/2010   Method used:   Print then Give to Patient   RxID:   [id number] ALENDRONATE SODIUM 70 MG TABS (ALENDRONATE SODIUM) one by mouth qwkly  #12 x 3   Entered and Authorized by:   D. Thomos Lemons DO   Signed by:   D. Thomos Lemons DO on 08/06/2010   Method used:   Print then Give to Patient   RxID:   [id number] SIMVASTATIN 40 MG TABS (SIMVASTATIN) Take 1 tab by mouth at bedtime  #90 Tablet x 1   Entered and Authorized by:   D. Thomos Lemons DO   Signed by:   D. Thomos Lemons DO on 08/06/2010   Method used:   Print then Give to Patient   RxID:    [id number] JANUMET 50-500 MG TABS (SITAGLIPTIN-METFORMIN HCL) one by mouth two times a day  #180 x 1   Entered and Authorized by:   D. Thomos Lemons DO   Signed by:   D. Thomos Lemons DO on 08/06/2010   Method used:   Print then Give to Patient   RxID:   [id number]    Orders Added: 1)  T-Basic Metabolic Panel [  [id number]] 2)  T-TSH [[id number]] 3)  T- Hemoglobin A1C [[id number]] 4)  T-Urine Microalbumin w/creat. ratio [[id number]] 5)  Podiatry Referral [Podiatry] 6)  Est. Patient Level IV [[address]]   Immunization History:  Influenza Immunization History:    Influenza:  historical (06/30/2010)   Immunization History:  Influenza Immunization History:    Influenza:  Historical (06/30/2010)  Current Allergies (reviewed today): ! SULFA RAMIPRIL (RAMIPRIL)

## 2010-09-17 NOTE — Letter (Signed)
Summary: Primary Care Consult Scheduled Letter  [place] at [facility]  [address]   [place], [place] [address]   Phone: [phone]  Fax: [phone]      08/07/2010 MRN: [id number]  LUNDEN STIEBER [address] [place], [place]  [address]    Dear Ms. [place],      We have scheduled an appointment for you.  At the recommendation of Dr.YOO, we have scheduled you a consult with [facility], DR Ralene Cork  on [place] 4,2012 at 2:15PM.  Their address is_[address]  . The office phone number is [phone].  If this appointment day and time is not convenient for you, please feel free to call the office of the doctor you are being referred to at the number listed above and reschedule the appointment.     It is important for you to keep your scheduled appointments. We are here to make sure you are given good patient care    Thank you,  Darral Dash   Patient Care Coordinator [place] at [facility]

## 2010-09-17 NOTE — Miscellaneous (Signed)
Summary: Antibody Assessment/Teena Mcbride PhD  Antibody Assessment/Teena Mcbride PhD   Imported By: Lanelle Bal 09/01/2010 09:30:29  _____________________________________________________________________  External Attachment:    Type:   Image     Comment:   External Document

## 2010-09-17 NOTE — Progress Notes (Signed)
Summary: Januvia  Phone Note Call from Patient Call back at [facility] Phone [phone]   Caller: Patient Call For: D. Thomos Lemons DO Summary of Call: Pt left voice message stating she was told to start new Rx of Janumet. She still has 54 tablets of Januvia left and would like to get sufficient quantity of Metformin to last until her Januvia runs out, then she will start new Rx. Please advise.  Initial call taken by: Mervin Kung CMA Duncan Dull),  August 24, 2010 4:57 PM  Follow-up for Phone Call        ok to call in metformin 500 mg by mouth two times a day  qty #108.  no refills remind pt she is to only take one tab of Januvia 100 mg daily while she is finishing her last rx Follow-up by: D. Thomos Lemons DO,  August 24, 2010 5:35 PM  Additional Follow-up for Phone Call Additional follow up Details #1::        call returned to patient at [phone], she has been advised per Dr Artist Pais instructions. no additional concerns at this time Additional Follow-up by: Glendell Docker CMA,  August 25, 2010 10:06 AM    New/Updated Medications: METFORMIN HCL 500 MG TABS (METFORMIN HCL) Take 1 tablet by mouth two times a day Prescriptions: METFORMIN HCL 500 MG TABS (METFORMIN HCL) Take 1 tablet by mouth two times a day  #108 x 0   Entered by:   Glendell Docker CMA   Authorized by:   D. Thomos Lemons DO   Signed by:   Glendell Docker CMA on 08/25/2010   Method used:   [facility]  [address] [phone][facility]       [address]       [place], [place]  [address]       Ph: [phone] or [phone]       Fax: [phone]   RxID:   [id number]

## 2010-10-06 ENCOUNTER — Telehealth: Payer: Self-pay | Admitting: Internal Medicine

## 2010-10-13 NOTE — Progress Notes (Signed)
Summary: pt wants written rx  Phone Note Call from Patient   Caller: Patient Call For: NURSE Summary of Call: pt moved to [place] and would like a copy of her written rx's. pt would like those mailed to her at new address. [address] [place] [address]. cell# [phone].please assist. Initial call taken by: Elba Barman,  October 06, 2010 3:31 PM  Follow-up for Phone Call        ok to provide rx for 90 day supply of her meds.   she will have three months to find new pcp Follow-up by: D. Thomos Lemons DO,  October 07, 2010 7:50 PM  Additional Follow-up for Phone Call Additional follow up Details #1::        rxs printed and forwarded to Dr Artist Pais for signature. Additional Follow-up by: Glendell Docker CMA,  October 08, 2010 8:13 AM    Additional Follow-up for Phone Call Additional follow up Details #2::    call placed to patient at [phone], no answer. A detailed voice message was left for patient to call back to verify if she is taking Metformin or Janumet. Follow-up by: Glendell Docker CMA,  October 08, 2010 3:42 PM  Additional Follow-up for Phone Call Additional follow up Details #3:: Details for Additional Follow-up Action Taken: patient returned phone call and verified that she is not taking the Metformin, she is taking the Janumet. She has requested the rx's be mailed to the address on file. Rx mailed to patient Additional Follow-up by: Glendell Docker CMA,  October 09, 2010 8:49 AM  Prescriptions: METFORMIN HCL 500 MG TABS (METFORMIN HCL) Take 1 tablet by mouth two times a day  #108 x 0   Entered by:   Glendell Docker CMA   Authorized by:   D. Thomos Lemons DO   Signed by:   Glendell Docker CMA on 10/08/2010   Method used:   Print then Mail to Patient   RxID:   [id number] JANUMET 50-500 MG TABS (SITAGLIPTIN-METFORMIN HCL) one by mouth two times a day  #180 x 0   Entered by:   Glendell Docker CMA   Authorized by:   D. Thomos Lemons DO   Signed by:   Glendell Docker CMA on 10/08/2010   Method used:   Print then Mail to Patient   RxID:   [id number] DIOVAN 160 MG TABS (VALSARTAN) one by mouth once daily  #90 x 0   Entered by:   Glendell Docker CMA   Authorized by:   D. Thomos Lemons DO   Signed by:   Glendell Docker CMA on 10/08/2010   Method used:   Print then Mail to Patient   RxID:   [id number] ALENDRONATE SODIUM 70 MG TABS (ALENDRONATE SODIUM) one by mouth qwkly  #12 x 0   Entered by:   Glendell Docker CMA   Authorized by:   D. Thomos Lemons DO   Signed by:   Glendell Docker CMA on 10/08/2010   Method used:   Print then Mail to Patient   RxID:   [id number] SIMVASTATIN 40 MG TABS (SIMVASTATIN) Take 1 tab by mouth at bedtime  #90 x 0   Entered by:   Glendell Docker CMA   Authorized by:   D. Thomos Lemons DO   Signed by:   Glendell Docker CMA on 10/08/2010   Method used:   Print then Mail to Patient   RxID:   [id number] SYNTHROID 88 MCG TABS (LEVOTHYROXINE SODIUM) Take 1 tablet by  mouth once a day  #90 Tablet x 0   Entered by:   Glendell Docker CMA   Authorized by:   D. Thomos Lemons DO   Signed by:   Glendell Docker CMA on 10/08/2010   Method used:   Print then Mail to Patient   RxID:   [id number]

## 2010-11-03 ENCOUNTER — Other Ambulatory Visit: Payer: Self-pay | Admitting: *Deleted

## 2010-11-03 DIAGNOSIS — I10 Essential (primary) hypertension: Secondary | ICD-10-CM

## 2010-11-03 NOTE — Telephone Encounter (Signed)
Patient called stating she has not received the rx's  That were mailed to her, and she is out of her Diovan and would [facility] [place]. [facility]  [facility] [phone] [address] [facility] [place]

## 2010-11-06 MED ORDER — VALSARTAN 160 MG PO TABS: 160.0000 mg | ORAL_TABLET | Freq: Every day | ORAL | Status: DC

## 2010-11-10 ENCOUNTER — Other Ambulatory Visit: Payer: Self-pay | Admitting: *Deleted

## 2010-11-10 DIAGNOSIS — E039 Hypothyroidism, unspecified: Secondary | ICD-10-CM

## 2010-11-10 MED ORDER — LEVOTHYROXINE SODIUM 88 MCG PO TABS: 88.0000 ug | ORAL_TABLET | Freq: Every day | ORAL | Status: AC

## 2010-11-10 NOTE — Telephone Encounter (Signed)
Patient called and left voice message stating she is out of her Synthroid rx and is in need of a refill. She states she has not received the rx's that were mailed from the office and would appreciate if Dr Artist Pais would authorize a refill on this medication.   Rx refill sent to [facility] [place] per patient request, call placed to patient [phone], she was informed rx sent to pharmacy, and she has no further refill request at this time.

## 2010-11-19 ENCOUNTER — Ambulatory Visit: Payer: Self-pay | Admitting: Internal Medicine

## 2021-02-11 ENCOUNTER — Telehealth: Payer: Self-pay | Admitting: *Deleted

## 2021-02-11 NOTE — Telephone Encounter (Signed)
Per referral Dr. Leonie Green - called and gave upcoming appointments - patient confirmed - mailed welcome packet with calendar

## 2021-02-26 ENCOUNTER — Ambulatory Visit
Admission: RE | Admit: 2021-02-26 | Discharge: 2021-02-26 | Disposition: A | Discharge status: Home / Self Care | Payer: BC Managed Care – PPO | Source: Ambulatory Visit | Attending: Physician Assistant | Admitting: Physician Assistant

## 2021-02-26 ENCOUNTER — Other Ambulatory Visit: Payer: Self-pay | Admitting: Physician Assistant

## 2021-02-26 DIAGNOSIS — R200 Anesthesia of skin: Secondary | ICD-10-CM

## 2021-03-05 ENCOUNTER — Ambulatory Visit (INDEPENDENT_AMBULATORY_CARE_PROVIDER_SITE_OTHER): Payer: BC Managed Care – PPO | Admitting: Podiatry

## 2021-03-05 ENCOUNTER — Other Ambulatory Visit: Payer: Self-pay

## 2021-03-05 DIAGNOSIS — M79671 Pain in right foot: Secondary | ICD-10-CM | POA: Diagnosis not present

## 2021-03-05 DIAGNOSIS — D3610 Benign neoplasm of peripheral nerves and autonomic nervous system, unspecified: Secondary | ICD-10-CM | POA: Diagnosis not present

## 2021-03-05 NOTE — Patient Instructions (Signed)
Morton Neuralgia  Morton neuralgia is foot pain that affects the ball of the foot and the area near the toes. Morton neuralgia occurs when part of a nerve in the foot (digital nerve) is under too much pressure (compressed). When this happens over a long period of time, the nerve can thicken (neuroma) and cause pain. Pain usually occurs between the third and fourth toes.  Morton neuralgia can come and go but may get worse over time. What are the causes? This condition is caused by doing the same things over and over with your foot, such as: Activities such as running or jumping. Wearing shoes that are too tight. What increases the risk? You may be at higher risk for Morton neuralgia if you: Are female. Wear high heels. Wear shoes that are narrow or tight. Do activities that repeatedly stretch your toes, such as: Running. Ballet. Long-distance walking. What are the signs or symptoms? The first symptom of Morton neuralgia is pain that spreads from the ball of the foot to the toes. It may feel like you are walking on a marble. Pain usually gets worse with walking and goes away at night. Other symptoms may include numbness and cramping of your toes. Both feet are equally affected, but rarelyat the same time. How is this diagnosed? This condition is diagnosed based on your symptoms, your medical history, and a physical exam. Your health care provider may: Squeeze your foot just behind your toe. Ask you to move your toes to check for pain. Ask about your physical activity level. You also may have imaging tests, such as an X-ray, ultrasound, or MRI. How is this treated? Treatment depends on how severe your condition is and what causes it. Treatment may involve: Wearing different shoes that are not too tight, are low-heeled, and provide good support. For some people, this is the only treatment needed. Wearing an over-the-counter or custom supportive pad (orthotic) under the front of your  foot. Getting injections of numbing medicine and anti-inflammatory medicine (steroid) in the nerve. Having surgery to remove part of the thickened nerve. Follow these instructions at home: Managing pain, stiffness, and swelling  Massage your foot as needed. Wear orthotics as told by your health care provider. If directed, put ice on your foot: Put ice in a plastic bag. Place a towel between your skin and the bag. Leave the ice on for 20 minutes, 2-3 times a day. Avoid activities that cause pain or make pain worse. If you play sports, ask your health care provider when it is safe for you to return to sports. Raise (elevate) your foot above the level of your heart while lying down and, when possible, while sitting.  General instructions Take over-the-counter and prescription medicines only as told by your health care provider. Do not drive or use heavy machinery while taking prescription pain medicine. Wear shoes that: Have soft soles. Have a wide toe area. Provide arch support. Do not pinch or squeeze your feet. Have room for your orthotics, if applicable. Keep all follow-up visits as told by your health care provider. This is important. Contact a health care provider if: Your symptoms get worse or do not get better with treatment and home care. Summary Morton neuralgia is foot pain that affects the ball of the foot and the area near the toes. Pain usually occurs between the third and fourth toes, gets worse with walking, and goes away at night. Morton neuralgia occurs when part of a nerve in the foot (digital nerve)  is under too much pressure. When this happens over a long period of time, the nerve can thicken (neuroma) and cause pain. This condition is caused by doing the same things over and over with your foot, such as running or jumping, wearing shoes that are too tight, or wearing high heels. Treatment may involve wearing low-heeled shoes that are not too tight, wearing a  supportive pad (orthotic) under the front of your foot, getting injections in the nerve, or having surgery to remove part of the thickened nerve. This information is not intended to replace advice given to you by your health care provider. Make sure you discuss any questions you have with your healthcare provider. Document Revised: 08/16/2017 Document Reviewed: 08/16/2017 Elsevier Patient Education  2022 [employer].

## 2021-03-10 NOTE — Progress Notes (Signed)
Subjective:   Patient ID: Virginia Travis, female   DOB: 65 y.o.   MRN: [id number]   HPI 65 year old female presents the office today for concerns of pain in the right foot which is mild about 1 month and she recently started to develop some numbness into some of the toes on the right foot.  She denies any recent injury or trauma.  She is recently had x-rays by her primary care physician's office.  No fracture was noted.  She does get occasional back pain that has been ongoing for some time and only in certain position when trying get out of bed.  No pain going down the legs.  She said no recent treatment for her foot.  No other concerns.   Review of Systems  All other systems reviewed and are negative.  Past Medical History:  Diagnosis Date   Diabetes mellitus    type II   Hyperlipidemia    Hypertension    Osteoporosis    Thyroid disease    hypothyroidism      Current Outpatient Medications:    alendronate (FOSAMAX) 70 MG tablet, Take 70 mg by mouth every 7 (seven) days. Take in the morning with a full glass of water, on an empty stomach, and do not take anything else by mouth or lie down for the next 30 min. , Disp: , Rfl:    aspirin 81 MG tablet, Take 81 mg by mouth daily.  , Disp: , Rfl:    Cholecalciferol (VITAMIN D3) 1000 UNIT capsule, Take 1,000 Units by mouth daily.  , Disp: , Rfl:    levothyroxine (SYNTHROID, LEVOTHROID) 88 MCG tablet, Take 1 tablet (88 mcg total) by mouth daily., Disp: 90 tablet, Rfl: 0   losartan (COZAAR) 50 MG tablet, Take by mouth., Disp: , Rfl:    metFORMIN (GLUCOPHAGE) 500 MG tablet, Take 500 mg by mouth 2 (two) times daily with meals.  , Disp: , Rfl:    Multiple Vitamins-Minerals (MULTIVITAMIN WITH MINERALS) tablet, Take 1 tablet by mouth daily.  , Disp: , Rfl:    simvastatin (ZOCOR) 20 MG tablet, SMARTSIG:1 Tablet(s) By Mouth Every Evening, Disp: , Rfl:    simvastatin (ZOCOR) 40 MG tablet, Take 40 mg by mouth at bedtime.  , Disp: , Rfl:     sitaGLIPtan-metformin (JANUMET) 50-500 MG per tablet, Take 1 tablet by mouth 2 (two) times daily with meals.  , Disp: , Rfl:    valsartan (DIOVAN) 160 MG tablet, Take 1 tablet (160 mg total) by mouth daily., Disp: 90 tablet, Rfl: 0  Allergies  Allergen Reactions   Ramipril     REACTION: cough   Sulfonamide Derivatives     REACTION: Hives, Nausea          Objective:  Physical Exam  General: AAO x3, NAD  Dermatological: Skin is warm, dry and supple bilateral. There are no open sores, no preulcerative lesions, no rash or signs of infection present.  Vascular: Dorsalis Pedis artery and Posterior Tibial artery pedal pulses are 2/4 bilateral with immedate capillary fill time. There is no pain with calf compression, swelling, warmth, erythema.   Neruologic: Grossly intact via light touch bilateral.  Negative Tinel sign.  Musculoskeletal: Tenderness palpation on second interspace with small palpable neuroma identified.  She does get numbness into her second and third toes.  No edema.  No area of pinpoint tenderness.  Muscular strength 5/5 in all groups tested bilateral.  Gait: Unassisted, Nonantalgic.       Assessment:  Second interspace neuroma     Plan:  -Treatment options discussed including all alternatives, risks, and complications -Etiology of symptoms were discussed -Reviewed the previous x-rays. -Discussed steroid injection -Neuroma dispensed.  Discussed shoe modifications and offloading.  Trula Slade DPM

## 2021-03-17 ENCOUNTER — Other Ambulatory Visit: Payer: Self-pay | Admitting: Family

## 2021-03-17 DIAGNOSIS — D75839 Thrombocytosis, unspecified: Secondary | ICD-10-CM

## 2021-03-18 ENCOUNTER — Other Ambulatory Visit: Payer: Self-pay

## 2021-03-18 ENCOUNTER — Encounter: Payer: Self-pay | Admitting: Family

## 2021-03-18 ENCOUNTER — Telehealth: Payer: Self-pay | Admitting: *Deleted

## 2021-03-18 ENCOUNTER — Inpatient Hospital Stay (HOSPITAL_BASED_OUTPATIENT_CLINIC_OR_DEPARTMENT_OTHER): Payer: BC Managed Care – PPO | Admitting: Family

## 2021-03-18 ENCOUNTER — Inpatient Hospital Stay: Payer: BC Managed Care – PPO | Attending: Hematology & Oncology

## 2021-03-18 VITALS — BP 132/66 | HR 69 | Resp 17 | Ht 64.0 in | Wt 235.0 lb

## 2021-03-18 DIAGNOSIS — E039 Hypothyroidism, unspecified: Secondary | ICD-10-CM | POA: Insufficient documentation

## 2021-03-18 DIAGNOSIS — E119 Type 2 diabetes mellitus without complications: Secondary | ICD-10-CM

## 2021-03-18 DIAGNOSIS — I10 Essential (primary) hypertension: Secondary | ICD-10-CM

## 2021-03-18 DIAGNOSIS — E785 Hyperlipidemia, unspecified: Secondary | ICD-10-CM | POA: Insufficient documentation

## 2021-03-18 DIAGNOSIS — D75839 Thrombocytosis, unspecified: Secondary | ICD-10-CM | POA: Diagnosis present

## 2021-03-18 DIAGNOSIS — D509 Iron deficiency anemia, unspecified: Secondary | ICD-10-CM

## 2021-03-18 LAB — CBC WITH DIFFERENTIAL (CANCER CENTER ONLY)
Abs Immature Granulocytes: 0.04 10*3/uL (ref 0.00–0.07)
Basophils Absolute: 0.1 10*3/uL (ref 0.0–0.1)
Basophils Relative: 1 %
Eosinophils Absolute: 0.1 10*3/uL (ref 0.0–0.5)
Eosinophils Relative: 1 %
HCT: 39.4 % (ref 36.0–46.0)
Hemoglobin: 12.3 g/dL (ref 12.0–15.0)
Immature Granulocytes: 1 %
Lymphocytes Relative: 33 %
Lymphs Abs: 2.7 10*3/uL (ref 0.7–4.0)
MCH: 29 pg (ref 26.0–34.0)
MCHC: 31.2 g/dL (ref 30.0–36.0)
MCV: 92.9 fL (ref 80.0–100.0)
Monocytes Absolute: 0.8 10*3/uL (ref 0.1–1.0)
Monocytes Relative: 10 %
Neutro Abs: 4.5 10*3/uL (ref 1.7–7.7)
Neutrophils Relative %: 54 %
Platelet Count: 430 10*3/uL — ABNORMAL HIGH (ref 150–400)
RBC: 4.24 MIL/uL (ref 3.87–5.11)
RDW: 13.9 % (ref 11.5–15.5)
WBC Count: 8.1 10*3/uL (ref 4.0–10.5)
nRBC: 0 % (ref 0.0–0.2)

## 2021-03-18 LAB — CMP (CANCER CENTER ONLY)
ALT: 12 U/L (ref 0–44)
AST: 18 U/L (ref 15–41)
Albumin: 4.1 g/dL (ref 3.5–5.0)
Alkaline Phosphatase: 78 U/L (ref 38–126)
Anion gap: 7 (ref 5–15)
BUN: 20 mg/dL (ref 8–23)
CO2: 30 mmol/L (ref 22–32)
Calcium: 10.2 mg/dL (ref 8.9–10.3)
Chloride: 106 mmol/L (ref 98–111)
Creatinine: 0.68 mg/dL (ref 0.44–1.00)
GFR, Estimated: >60 mL/min (ref >60)
Glucose, Bld: 91 mg/dL (ref 70–99)
Potassium: 5.2 mmol/L — ABNORMAL HIGH (ref 3.5–5.1)
Sodium: 143 mmol/L (ref 135–145)
Total Bilirubin: 0.3 mg/dL (ref 0.3–1.2)
Total Protein: 7.5 g/dL (ref 6.5–8.1)

## 2021-03-18 LAB — LACTATE DEHYDROGENASE: LDH: 167 U/L (ref 98–192)

## 2021-03-18 LAB — SAVE SMEAR(SSMR), FOR PROVIDER SLIDE REVIEW

## 2021-03-18 NOTE — Progress Notes (Signed)
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  Platelets today are 430, WBC count 8.1, Hgb 12.3 and MCV 92.  She is taking 1 baby Asprin daily.  She notes occasional palpitations.  She states that she has history of iron deficiency and has not been able to donate blood in the past.  No issue with bleeding. No bruising or petechiae.  She is diabetic and takes Metformin. Her most recent Hgb A1c was 6.4.  She has hypothyroidism and is on Synthroid. Most recent TSH was therapeutic.  She states that there is a strong family history of HTN on her mother's side.  No personal or familial history of cancer.  She had her mammogram and bone density scan last week and is awaiting results.  Her last colonoscopy was 5 years ago while living in [place]. She states that she had polyps removed at that time and is due again for colonoscopy this year. Her PCP has referred her to GI for further eval.  Her only surgical history was arthroscopic right knee surgery 30 + years ago. No complications.  No hot flashes or night sweats. She went through the change of life naturally and still has her reproductive organs in place.  No fever, chills, n/v, cough, rash, dizziness, SOB, chest pain, abdominal pain or changes in bowel or bladder habits.  No swelling, tenderness, numbness or tingling in her extremities at this time.  No falls or syncope to report.  No smoking or recreational drug use. She has the occasional glass of wine socially.  She has maintained a good appetite and is staying well hydrated. Her weight is stable at 235 lbs.  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  She is looking forward to getting a Physiological scientist and getting back into the gym.   ROS: All other 10 point review of systems is negative.   PAST MEDICAL HISTORY:   Past Medical History:  Diagnosis Date   Diabetes mellitus    type II   Hyperlipidemia    Hypertension    Osteoporosis    Thyroid disease    hypothyroidism    ALLERGIES: Allergies  Allergen Reactions   Ramipril     REACTION: cough   Sulfonamide Derivatives     REACTION: Hives, Nausea      MEDICATIONS:  Current Outpatient Medications on File Prior to Visit  Medication Sig Dispense Refill   aspirin 81 MG tablet Take 81 mg by mouth daily.       Cholecalciferol 25 MCG (1000 UT) capsule Take 1,000 Units by mouth daily.       levothyroxine (SYNTHROID, LEVOTHROID) 88 MCG tablet Take 1 tablet (88 mcg total) by mouth daily. 90 tablet 0   losartan (COZAAR) 50 MG tablet Take by mouth.     metFORMIN (GLUCOPHAGE) 500 MG tablet Take 500 mg by mouth 2 (two) times daily with meals.       Multiple Vitamins-Minerals (MULTIVITAMIN WITH MINERALS) tablet Take 1 tablet by mouth daily.       simvastatin (ZOCOR) 20 MG tablet SMARTSIG:1 Tablet(s) By Mouth Every Evening  No current facility-administered medications on file prior to visit.     PAST SURGICAL HISTORY Reviewed with patient.   FAMILY HISTORY: Family History  Problem Relation Age of Onset   Cancer Neg Hx    Hyperlipidemia Other    Hypertension Other     SOCIAL HISTORY:  reports that she has never smoked. She has never used smokeless tobacco. She reports current alcohol use of about 1.0 standard drink of alcohol per week. No history on file for drug use.  PERFORMANCE STATUS: The patient's performance status is 0 - Asymptomatic  PHYSICAL EXAM: Most Recent Vital Signs: Blood pressure 132/66, pulse 69, resp. rate 17, height '5\' 4"'$  (1.626 m), weight 235 lb (106.6 kg), SpO2 100 %. BP 132/66 (BP Location: Left Arm,  Patient Position: Sitting)   Pulse 69   Resp 17   Ht '5\' 4"'$  (1.626 m)   Wt 235 lb (106.6 kg)   SpO2 100%   BMI 40.34 kg/m   General Appearance:    Alert, cooperative, no distress, appears stated age  Head:    Normocephalic, without obvious abnormality, atraumatic  Eyes:    PERRL, conjunctiva/corneas clear, EOM's intact, fundi    benign, both eyes        Throat:   Lips, mucosa, and tongue normal; teeth and gums normal  Neck:   Supple, symmetrical, trachea midline, no adenopathy;    thyroid:  no enlargement/tenderness/nodules; no carotid   bruit or JVD  Back:     Symmetric, no curvature, ROM normal, no CVA tenderness  Lungs:     Clear to auscultation bilaterally, respirations unlabored  Chest Wall:    No tenderness or deformity   Heart:    Regular rate and rhythm, S1 and S2 normal, no murmur, rub   or gallop     Abdomen:     Soft, non-tender, bowel sounds active all four quadrants,    no masses, no organomegaly        Extremities:   Extremities normal, atraumatic, no cyanosis or edema  Pulses:   2+ and symmetric all extremities  Skin:   Skin color, texture, turgor normal, no rashes or lesions  Lymph nodes:   Cervical, supraclavicular, and axillary nodes normal  Neurologic:   CNII-XII intact, normal strength, sensation and reflexes    throughout    LABORATORY DATA:  Results for orders placed or performed in visit on 03/18/21 (from the past 48 hour(s))  CBC with Differential (Cancer Center Only)     Status: Abnormal   Collection Time: 03/18/21  2:32 PM  Result Value Ref Range   WBC Count 8.1 4.0 - 10.5 K/uL   RBC 4.24 3.87 - 5.11 MIL/uL   Hemoglobin 12.3 12.0 - 15.0 g/dL   HCT 39.4 36.0 - 46.0 %   MCV 92.9 80.0 - 100.0 fL   MCH 29.0 26.0 - 34.0 pg   MCHC 31.2 30.0 - 36.0 g/dL   RDW 13.9 11.5 - 15.5 %   Platelet Count 430 (H) 150 - 400 K/uL   nRBC 0.0 0.0 - 0.2 %   Neutrophils Relative % 54 %   Neutro Abs 4.5 1.7 - 7.7 K/uL   Lymphocytes Relative 33 %   Lymphs Abs 2.7  0.7 - 4.0 K/uL   Monocytes Relative 10 %   Monocytes Absolute 0.8 0.1 - 1.0 K/uL   Eosinophils Relative 1 %   Eosinophils Absolute 0.1 0.0 - 0.5 K/uL   Basophils Relative 1 %   Basophils Absolute 0.1 0.0 -  0.1 K/uL   Immature Granulocytes 1 %   Abs Immature Granulocytes 0.04 0.00 - 0.07 K/uL    Comment: Performed at [facility] at [facility], [address], [place], [place] [address]  CMP ([place] only)     Status: Abnormal   Collection Time: 03/18/21  2:32 PM  Result Value Ref Range   Sodium 143 135 - 145 mmol/L   Potassium 5.2 (H) 3.5 - 5.1 mmol/L   Chloride 106 98 - 111 mmol/L   CO2 30 22 - 32 mmol/L   Glucose, Bld 91 70 - 99 mg/dL    Comment: Glucose reference range applies only to samples taken after fasting for at least 8 hours.   BUN 20 8 - 23 mg/dL   Creatinine 0.68 0.44 - 1.00 mg/dL   Calcium 10.2 8.9 - 10.3 mg/dL   Total Protein 7.5 6.5 - 8.1 g/dL   Albumin 4.1 3.5 - 5.0 g/dL   AST 18 15 - 41 U/L   ALT 12 0 - 44 U/L   Alkaline Phosphatase 78 38 - 126 U/L   Total Bilirubin 0.3 0.3 - 1.2 mg/dL   GFR, Estimated >60 >60 mL/min    Comment: (NOTE) Calculated using the CKD-EPI Creatinine Equation (2021)    Anion gap 7 5 - 15    Comment: Performed at [facility] Lab at [facility], [address], [place], [place] [address]  Lactate dehydrogenase (LDH)     Status: None   Collection Time: 03/18/21  2:32 PM  Result Value Ref Range   LDH 167 98 - 192 U/L    Comment: Performed at [facility] Lab at [facility], [address], [place], [place] [address]  Save Smear [facility])     Status: None   Collection Time: 03/18/21  2:32 PM  Result Value Ref Range   Smear Review SMEAR STAINED AND AVAILABLE FOR REVIEW     Comment: Performed at [facility] Lab at [facility], [address], [place], [place] [address]      RADIOGRAPHY: No results found.     PATHOLOGY: None    ASSESSMENT/PLAN: Ms. 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  Lab work and blood smear reviewed with Dr. Marin Olp. No abnormality or evidence of malignancy noted.  She does have history of iron deficiency which can cause reactive thrombocytosis. We will see what her iron studies look like and get her onto a supplement is needed.  Follow-up in 6 months. If everything is stable at that time we can release her back to her PCP.   All questions were answered. The patient knows to call the clinic with any problems, questions or concerns. We can certainly see the patient much sooner if necessary.  The patient was discussed with Dr. Marin Olp and he is in agreement with the aforementioned.   Laverna Peace, NP

## 2021-03-18 NOTE — Telephone Encounter (Signed)
Per 03/18/21 los gave patient upcoming appointments - confirmed and print calendar

## 2021-03-19 LAB — IRON AND TIBC
Iron: 40 ug/dL — ABNORMAL LOW (ref 41–142)
Saturation Ratios: 12 % — ABNORMAL LOW (ref 21–57)
TIBC: 345 ug/dL (ref 236–444)
UIBC: 305 ug/dL (ref 120–384)

## 2021-03-19 LAB — FERRITIN: Ferritin: 30 ng/mL (ref 11–307)

## 2021-03-20 ENCOUNTER — Other Ambulatory Visit: Payer: Self-pay | Admitting: Family

## 2021-03-20 ENCOUNTER — Telehealth: Payer: Self-pay

## 2021-03-20 DIAGNOSIS — D509 Iron deficiency anemia, unspecified: Secondary | ICD-10-CM

## 2021-03-20 MED ORDER — FUSION PLUS PO CAPS: 1.0000 | ORAL_CAPSULE | Freq: Every day | ORAL | 11 refills | Status: DC

## 2021-03-20 MED ORDER — FUSION PLUS PO CAPS
1.0000 | ORAL_CAPSULE | Freq: Every day | ORAL | 11 refills | Status: DC
Start: 2021-03-20 — End: 2021-03-26

## 2021-03-20 NOTE — Telephone Encounter (Signed)
Called and informed patient of lab results, patient verbalized understanding and denies any questions or concerns at this time.   

## 2021-03-20 NOTE — Telephone Encounter (Signed)
-----   Message from Eliezer Bottom, NP sent at 03/20/2021 10:47 AM EDT ----- Iron studies are slightly low. Start taking Fusion Plus oral iron supplement daily. Let us know if you have any trouble tolerating. Thank you!   ----- Message ----- From: Interface, Lab In [place] Sent: 03/18/2021   2:38 PM EDT To: Eliezer Bottom, NP

## 2021-03-26 ENCOUNTER — Other Ambulatory Visit: Payer: Self-pay | Admitting: *Deleted

## 2021-03-26 DIAGNOSIS — D509 Iron deficiency anemia, unspecified: Secondary | ICD-10-CM

## 2021-03-26 MED ORDER — FUSION PLUS PO CAPS: 1.0000 | ORAL_CAPSULE | Freq: Every day | ORAL | 11 refills | Status: DC

## 2021-09-18 ENCOUNTER — Encounter: Payer: Self-pay | Admitting: Family

## 2021-09-18 ENCOUNTER — Inpatient Hospital Stay: Payer: BC Managed Care – PPO | Admitting: Family

## 2021-09-18 ENCOUNTER — Other Ambulatory Visit: Payer: Self-pay

## 2021-09-18 ENCOUNTER — Inpatient Hospital Stay: Payer: BC Managed Care – PPO | Attending: Hematology & Oncology

## 2021-09-18 ENCOUNTER — Encounter: Payer: Self-pay | Admitting: *Deleted

## 2021-09-18 VITALS — BP 146/53 | HR 65 | Temp 98.3°F | Resp 17 | Ht 64.0 in | Wt 243.1 lb

## 2021-09-18 DIAGNOSIS — D509 Iron deficiency anemia, unspecified: Secondary | ICD-10-CM

## 2021-09-18 DIAGNOSIS — D75839 Thrombocytosis, unspecified: Secondary | ICD-10-CM | POA: Insufficient documentation

## 2021-09-18 LAB — CMP (CANCER CENTER ONLY)
ALT: 14 U/L (ref 0–44)
AST: 20 U/L (ref 15–41)
Albumin: 4.3 g/dL (ref 3.5–5.0)
Alkaline Phosphatase: 98 U/L (ref 38–126)
Anion gap: 7 (ref 5–15)
BUN: 10 mg/dL (ref 8–23)
CO2: 30 mmol/L (ref 22–32)
Calcium: 10.3 mg/dL (ref 8.9–10.3)
Chloride: 103 mmol/L (ref 98–111)
Creatinine: 0.68 mg/dL (ref 0.44–1.00)
GFR, Estimated: >60 mL/min (ref >60)
Glucose, Bld: 105 mg/dL — ABNORMAL HIGH (ref 70–99)
Potassium: 4.3 mmol/L (ref 3.5–5.1)
Sodium: 140 mmol/L (ref 135–145)
Total Bilirubin: 0.4 mg/dL (ref 0.3–1.2)
Total Protein: 7.6 g/dL (ref 6.5–8.1)

## 2021-09-18 LAB — IRON AND IRON BINDING CAPACITY (CC-WL,HP ONLY)
Iron: 85 ug/dL (ref 28–170)
Saturation Ratios: 25 % (ref 10.4–31.8)
TIBC: 346 ug/dL (ref 250–450)
UIBC: 261 ug/dL (ref 148–442)

## 2021-09-18 LAB — CBC WITH DIFFERENTIAL (CANCER CENTER ONLY)
Abs Immature Granulocytes: 0.02 10*3/uL (ref 0.00–0.07)
Basophils Absolute: 0.1 10*3/uL (ref 0.0–0.1)
Basophils Relative: 1 %
Eosinophils Absolute: 0.1 10*3/uL (ref 0.0–0.5)
Eosinophils Relative: 1 %
HCT: 40.7 % (ref 36.0–46.0)
Hemoglobin: 12.7 g/dL (ref 12.0–15.0)
Immature Granulocytes: 0 %
Lymphocytes Relative: 42 %
Lymphs Abs: 3 10*3/uL (ref 0.7–4.0)
MCH: 29.5 pg (ref 26.0–34.0)
MCHC: 31.2 g/dL (ref 30.0–36.0)
MCV: 94.4 fL (ref 80.0–100.0)
Monocytes Absolute: 0.8 10*3/uL (ref 0.1–1.0)
Monocytes Relative: 12 %
Neutro Abs: 3.1 10*3/uL (ref 1.7–7.7)
Neutrophils Relative %: 44 %
Platelet Count: 453 10*3/uL — ABNORMAL HIGH (ref 150–400)
RBC: 4.31 MIL/uL (ref 3.87–5.11)
RDW: 13.5 % (ref 11.5–15.5)
WBC Count: 7.1 10*3/uL (ref 4.0–10.5)
nRBC: 0 % (ref 0.0–0.2)

## 2021-09-18 LAB — LACTATE DEHYDROGENASE: LDH: 152 U/L (ref 98–192)

## 2021-09-18 LAB — FERRITIN: Ferritin: 66 ng/mL (ref 11–307)

## 2021-09-18 LAB — SAVE SMEAR(SSMR), FOR PROVIDER SLIDE REVIEW

## 2021-09-18 NOTE — Progress Notes (Addendum)
Hematology and Oncology Follow Up Visit  Virginia Travis [id number] [birth year] 66 y.o. 09/18/2021   Principle Diagnosis:  Iron deficiency anemia Mild thrombocytosis   Current Therapy:   Fusion plus 1 capsule PO daily   Interim History:  Virginia Travis is here today for follow-up. She is doing quite well and has no complaints at this time.  She denies fatigue.  No blood loss noted. No bruising or petechiae.  Hgb is 12.7, MCV 94, platelets 453 and WBC count is 7.1.  No fever, chills, n/v, cough, rash, dizziness, SOB, chest pain, palpitations, abdominal pain or changes in bowel or bladder habits.  No swelling or tenderness in her extremities.  She has numbness and tingling in the bottoms of her feet and has followed up with neurology and ortho. 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  No falls or syncope to report.  She has a good appetite and is staying well hydrated. Her weight is stable at 243 lbs.   ECOG Performance Status: 0 - Asymptomatic  Medications:  Allergies as of 09/18/2021       Reactions   Ramipril    REACTION: cough   Sulfa Antibiotics Other (See Comments)   Sulfonamide Derivatives    REACTION: Hives, Nausea        Medication List        Accurate as of September 18, 2021 10:32 AM. If you have any questions, ask your nurse or doctor.          aspirin 81 MG tablet Take 81 mg by mouth daily.   aspirin 81 MG EC tablet 1 tablet   Cholecalciferol 25 MCG (1000 UT) capsule Take 1,000 Units by mouth daily.   cholecalciferol 25 MCG (1000 UNIT) tablet Commonly known as: VITAMIN D3 1 tablet   Fusion Plus Caps Take 1 capsule by mouth daily.   levothyroxine 88 MCG tablet Commonly known as: SYNTHROID Take 1 tablet (88 mcg total) by mouth daily.   losartan 50 MG tablet Commonly known as: COZAAR Take by mouth.   metFORMIN 500 MG tablet Commonly known as: GLUCOPHAGE Take 500 mg by mouth 2 (two) times daily with  meals.   MULTIVITAMIN ADULT PO 1 tablet   multivitamin with minerals tablet Take 1 tablet by mouth daily.   simvastatin 20 MG tablet Commonly known as: ZOCOR SMARTSIG:1 Tablet(s) By Mouth Every Evening        Allergies:  Allergies  Allergen Reactions   Ramipril     REACTION: cough   Sulfa Antibiotics Other (See Comments)   Sulfonamide Derivatives     REACTION: Hives, Nausea    Past Medical History, Surgical history, Social history, and Family History were reviewed and updated.  Review of Systems: All other 10 point review of systems is negative.   Physical Exam:  vitals were not taken for this visit.   Wt Readings from Last 3 Encounters:  03/18/21 235 lb (106.6 kg)    Ocular: Sclerae unicteric, pupils equal, round and reactive to light Ear-nose-throat: Oropharynx clear, dentition fair Lymphatic: No cervical or supraclavicular adenopathy Lungs no rales or rhonchi, good excursion bilaterally Heart regular rate and rhythm, no murmur appreciated Abd soft, nontender, positive bowel sounds MSK no focal spinal tenderness, no joint edema Neuro: non-focal, well-oriented, appropriate affect Breasts: Deferred   Lab Results  Component Value Date   WBC 7.1 09/18/2021   HGB 12.7 09/18/2021   HCT 40.7 09/18/2021   MCV 94.4 09/18/2021  PLT 453 (H) 09/18/2021   Lab Results  Component Value Date   FERRITIN 30 03/18/2021   IRON 40 (L) 03/18/2021   TIBC 345 03/18/2021   UIBC 305 03/18/2021   IRONPCTSAT 12 (L) 03/18/2021   Lab Results  Component Value Date   RBC 4.31 09/18/2021   No results found for: KPAFRELGTCHN, LAMBDASER, KAPLAMBRATIO No results found for: IGGSERUM, IGA, IGMSERUM No results found for: Odetta Pink, SPEI   Chemistry      Component Value Date/Time   NA 143 03/18/2021 1432   K 5.2 (H) 03/18/2021 1432   CL 106 03/18/2021 1432   CO2 30 03/18/2021 1432   BUN 20 03/18/2021 1432   CREATININE  0.68 03/18/2021 1432      Component Value Date/Time   CALCIUM 10.2 03/18/2021 1432   ALKPHOS 78 03/18/2021 1432   AST 18 03/18/2021 1432   ALT 12 03/18/2021 1432   BILITOT 0.3 03/18/2021 1432       Impression and Plan: Virginia Travis is a very pleasant 66 yo African American female with 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  Platelets have remained stable.  Iron studies today are improved.  She states that the fusion plus is expensive so we will have her change to gentle iron PO every other day.  We will follow-up as needed. She can contact our office with any questions or concerns.   Lottie Dawson, NP 2/3/202310:32 AM

## 2021-09-19 ENCOUNTER — Encounter: Payer: Self-pay | Admitting: Family

## 2021-09-22 ENCOUNTER — Telehealth: Payer: Self-pay | Admitting: *Deleted

## 2021-09-22 NOTE — Telephone Encounter (Signed)
Per 09/22/21 los - follow up as needed

## 2022-02-05 ENCOUNTER — Encounter: Payer: Self-pay | Admitting: Family

## 2022-09-30 IMAGING — DX DG FOOT 2V*R*
2 series · 2 of 2 positions shown · non-contrast
Comparison: None.

CLINICAL DATA: Foot numbness

EXAM:
RIGHT FOOT - 2 VIEW

[dg foot 2 views right (1 of 2)]
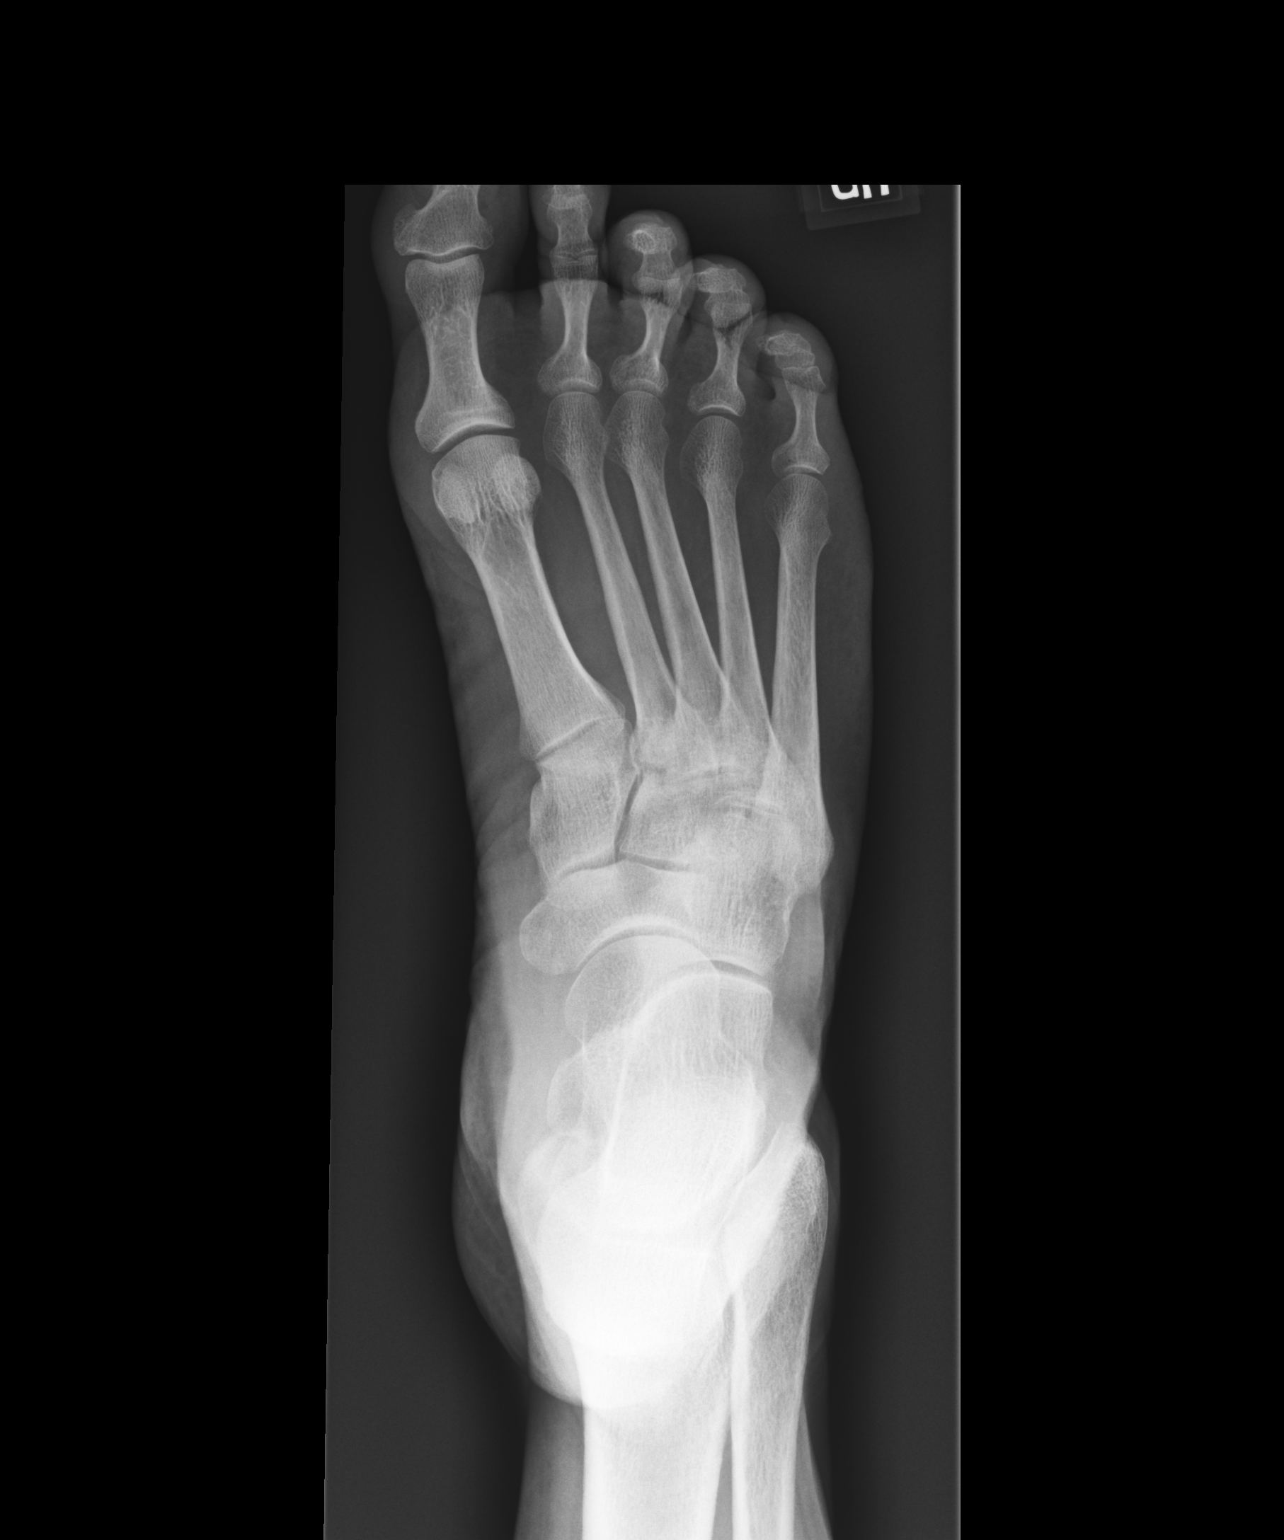

[dg foot 2 views right (2 of 2)]
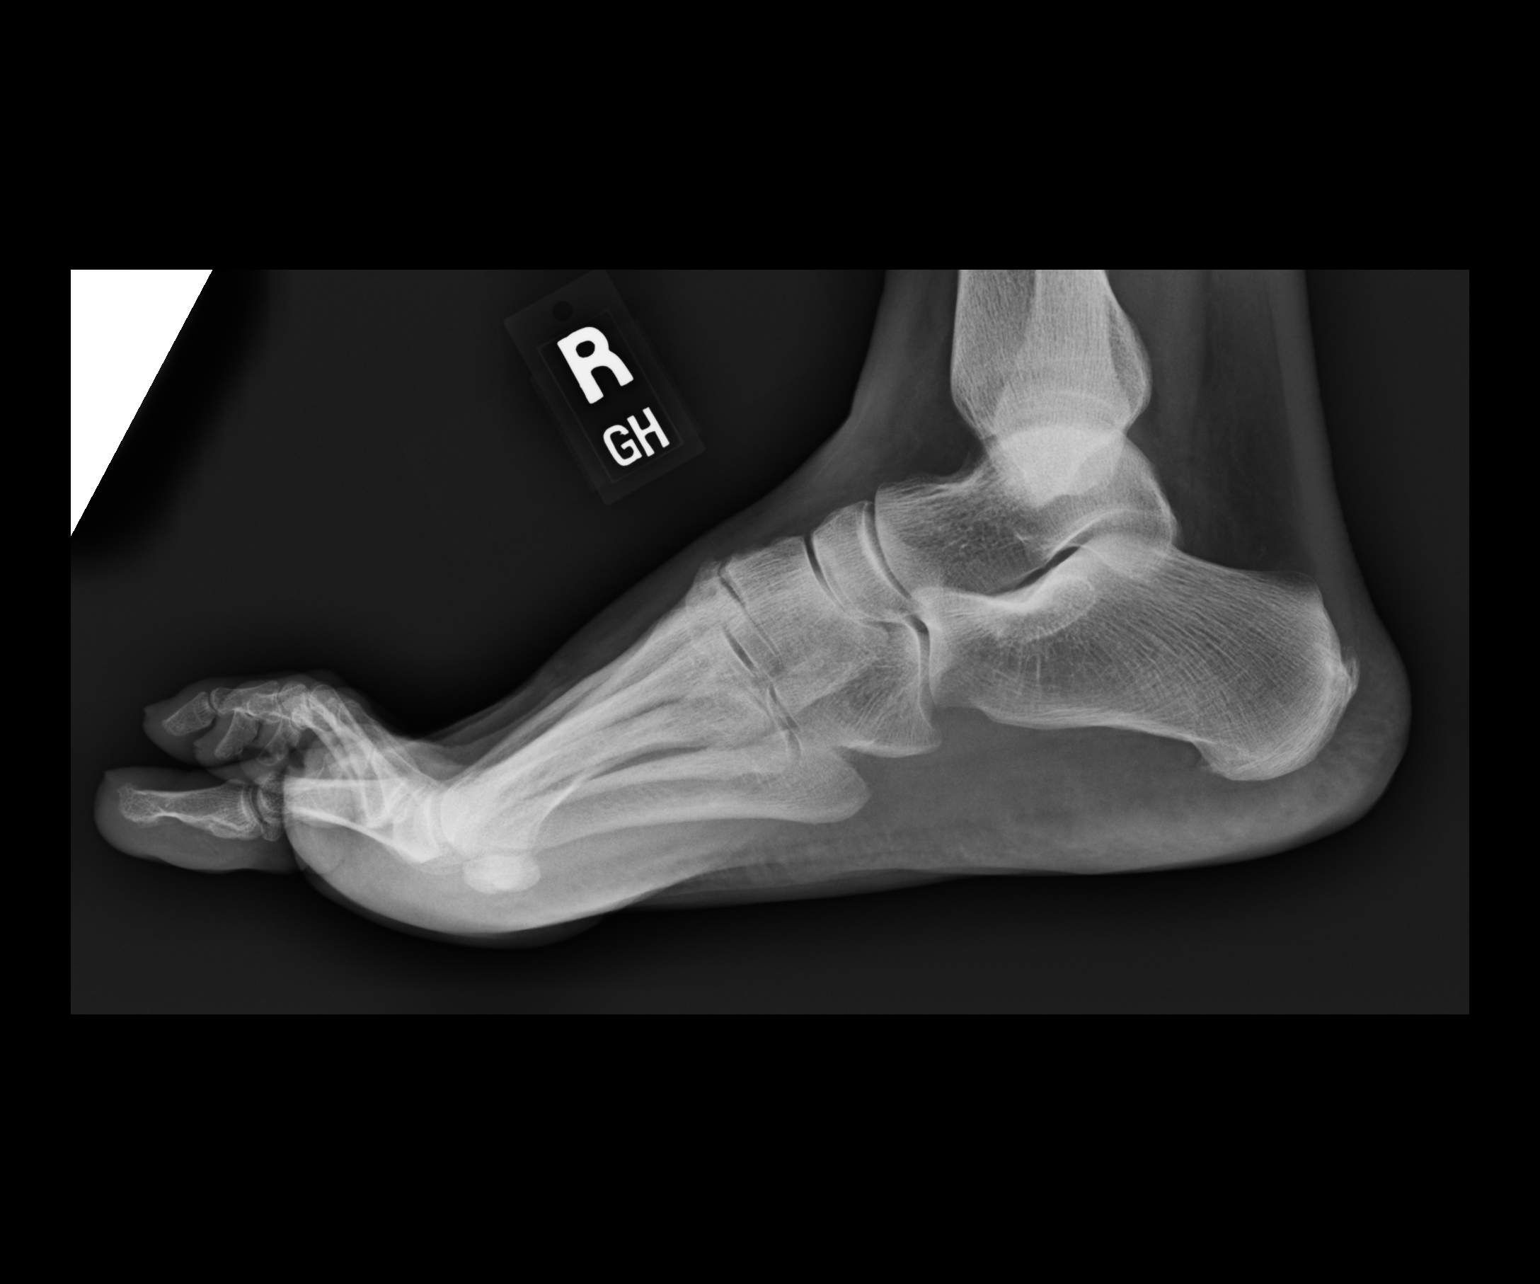

[2 of 2 positions shown; findings below may reference images not displayed]

FINDINGS: There is no evidence of fracture or dislocation. There is no
evidence of arthropathy or other focal bone abnormality. Soft
tissues are unremarkable. Small plantar calcaneal spur.
IMPRESSION: Negative.

## 2023-01-18 DIAGNOSIS — Z8249 Family history of ischemic heart disease and other diseases of the circulatory system: Secondary | ICD-10-CM | POA: Diagnosis not present

## 2023-01-18 DIAGNOSIS — L409 Psoriasis, unspecified: Secondary | ICD-10-CM | POA: Diagnosis not present

## 2023-01-18 DIAGNOSIS — I10 Essential (primary) hypertension: Secondary | ICD-10-CM | POA: Diagnosis not present

## 2023-01-18 DIAGNOSIS — E785 Hyperlipidemia, unspecified: Secondary | ICD-10-CM | POA: Diagnosis not present

## 2023-01-18 DIAGNOSIS — E1142 Type 2 diabetes mellitus with diabetic polyneuropathy: Secondary | ICD-10-CM | POA: Diagnosis not present

## 2023-01-18 DIAGNOSIS — E039 Hypothyroidism, unspecified: Secondary | ICD-10-CM | POA: Diagnosis not present

## 2023-01-18 DIAGNOSIS — M1990 Unspecified osteoarthritis, unspecified site: Secondary | ICD-10-CM | POA: Diagnosis not present

## 2023-01-18 DIAGNOSIS — Z7984 Long term (current) use of oral hypoglycemic drugs: Secondary | ICD-10-CM | POA: Diagnosis not present

## 2023-02-03 DIAGNOSIS — Z6841 Body Mass Index (BMI) 40.0 and over, adult: Secondary | ICD-10-CM | POA: Diagnosis not present

## 2023-02-03 DIAGNOSIS — M79671 Pain in right foot: Secondary | ICD-10-CM | POA: Diagnosis not present

## 2023-02-03 DIAGNOSIS — R350 Frequency of micturition: Secondary | ICD-10-CM | POA: Diagnosis not present

## 2023-02-23 DIAGNOSIS — E119 Type 2 diabetes mellitus without complications: Secondary | ICD-10-CM | POA: Diagnosis not present

## 2023-02-24 ENCOUNTER — Ambulatory Visit (INDEPENDENT_AMBULATORY_CARE_PROVIDER_SITE_OTHER): Payer: Medicare PPO | Admitting: Orthopedic Surgery

## 2023-02-24 ENCOUNTER — Other Ambulatory Visit (INDEPENDENT_AMBULATORY_CARE_PROVIDER_SITE_OTHER): Payer: Medicare PPO

## 2023-02-24 DIAGNOSIS — M6701 Short Achilles tendon (acquired), right ankle: Secondary | ICD-10-CM | POA: Diagnosis not present

## 2023-02-24 DIAGNOSIS — M25571 Pain in right ankle and joints of right foot: Secondary | ICD-10-CM

## 2023-02-24 DIAGNOSIS — M7661 Achilles tendinitis, right leg: Secondary | ICD-10-CM

## 2023-03-03 ENCOUNTER — Encounter: Payer: Self-pay | Admitting: Orthopedic Surgery

## 2023-03-03 NOTE — Progress Notes (Signed)
Office Visit Note   Patient: Virginia Travis           Date of Birth: [birth year]           MRN: [id number] Visit Date: 02/24/2023              Requested by: Renford Dills, MD [address] [place],  [place] [address] PCP: Renford Dills, MD  Chief Complaint  Patient presents with   Right Foot - Pain    Heel pain      HPI: Patient is a 67 year old woman who presents with a 1 year history of insertional Achilles tendon pain right ankle.  Patient denies any injury or trauma.  She states that walking is painful.  She has pain with start up.  Assessment & Plan: Visit Diagnoses:  1. Pain in right ankle and joints of right foot   2. Achilles tendinitis, right leg   3. Contracture of right Achilles tendon     Plan: Reviewed the Achilles contracture and pain at the insertion of the Achilles with Achilles tendinitis.  Recommended topical Voltaren gel 3 times a day Achilles stretching was demonstrated and she should do this 3 times a day.  Recommended follow-up with patient is unable to stretch the Achilles on her own.  Follow-Up Instructions: No follow-ups on file.   Ortho Exam  Patient is alert, oriented, no adenopathy, well-dressed, normal affect, normal respiratory effort. Examination patient has a good dorsalis pedis pulse she has no pain to palpation of the origin of the plantar fascia.  She has good ankle and subtalar motion.  With her knee extended she lacks 20 degrees of dorsiflexion to neutral.  She is tender to palpation at the insertion of the Achilles.  There is no palpable Achilles defect.  No recent hemoglobin A1c, over 10 years ago that showed prediabetes.  Imaging: No results found. No images are attached to the encounter.  Labs: Lab Results  Component Value Date   HGBA1C 6.2 (H) 08/06/2010   HGBA1C 6.4 (H) 04/01/2010   HGBA1C 6.7 (H) 10/14/2009     Lab Results  Component Value Date   ALBUMIN 4.3 09/18/2021   ALBUMIN 4.1 03/18/2021   ALBUMIN  4.3 06/12/2010    No results found for: "MG" No results found for: "VD25OH"  No results found for: "PREALBUMIN"    Latest Ref Rng & Units 09/18/2021   10:01 AM 03/18/2021    2:32 PM 05/19/2009    7:54 PM  CBC EXTENDED  WBC 4.0 - 10.5 K/uL 7.1  8.1  7.3   RBC 3.87 - 5.11 MIL/uL 4.31  4.24  4.22   Hemoglobin 12.0 - 15.0 g/dL 19.1  47.8  29.5   HCT 36.0 - 46.0 % 40.7  39.4  38.7   Platelets 150 - 400 K/uL 453  430  515   NEUT# 1.7 - 7.7 K/uL 3.1  4.5  3.6   Lymph# 0.7 - 4.0 K/uL 3.0  2.7  2.7      There is no height or weight on file to calculate BMI.  Orders:  Orders Placed This Encounter  Procedures   XR Foot Complete Right   No orders of the defined types were placed in this encounter.    Procedures: No procedures performed  Clinical Data: No additional findings.  ROS:  All other systems negative, except as noted in the HPI. Review of Systems  Objective: Vital Signs: There were no vitals taken for this visit.  Specialty Comments:  No specialty comments available.  PMFS History: Patient Active Problem List   Diagnosis Date Noted   PARESTHESIA 08/06/2010   PNEUMONIA 10/28/2009   COUGH 10/28/2009   PHARYNGITIS, ACUTE 10/22/2009   ECZEMA 07/14/2009   HYPOTHYROIDISM 05/15/2009   DIABETES MELLITUS, TYPE II 05/15/2009   HYPERLIPIDEMIA 05/15/2009   HYPERTENSION 05/15/2009   OSTEOPOROSIS 05/15/2009   Past Medical History:  Diagnosis Date   Diabetes mellitus    type II   Hyperlipidemia    Hypertension    Osteoporosis    Thyroid disease    hypothyroidism    Family History  Problem Relation Age of Onset   Cancer Neg Hx    Hyperlipidemia Other    Hypertension Other     History reviewed. No pertinent surgical history. Social History   Occupational History   Occupation: ACADEMIC COACH    Employer: [employer]  Tobacco Use   Smoking status: Never   Smokeless tobacco: Never  Vaping Use   Vaping status: Never Used  Substance and Sexual  Activity   Alcohol use: Yes    Alcohol/week: 1.0 standard drink of alcohol    Types: 1 Glasses of wine per week   Drug use: Not on file   Sexual activity: Not on file

## 2023-03-16 DIAGNOSIS — E119 Type 2 diabetes mellitus without complications: Secondary | ICD-10-CM | POA: Diagnosis not present

## 2023-03-21 DIAGNOSIS — Z1231 Encounter for screening mammogram for malignant neoplasm of breast: Secondary | ICD-10-CM | POA: Diagnosis not present

## 2023-04-04 DIAGNOSIS — Z5181 Encounter for therapeutic drug level monitoring: Secondary | ICD-10-CM | POA: Diagnosis not present

## 2023-04-04 DIAGNOSIS — L409 Psoriasis, unspecified: Secondary | ICD-10-CM | POA: Diagnosis not present

## 2023-04-06 DIAGNOSIS — Z23 Encounter for immunization: Secondary | ICD-10-CM | POA: Diagnosis not present

## 2023-04-06 DIAGNOSIS — E1142 Type 2 diabetes mellitus with diabetic polyneuropathy: Secondary | ICD-10-CM | POA: Diagnosis not present

## 2023-04-06 DIAGNOSIS — Z01419 Encounter for gynecological examination (general) (routine) without abnormal findings: Secondary | ICD-10-CM | POA: Diagnosis not present

## 2023-04-06 DIAGNOSIS — D75839 Thrombocytosis, unspecified: Secondary | ICD-10-CM | POA: Diagnosis not present

## 2023-04-06 DIAGNOSIS — E7800 Pure hypercholesterolemia, unspecified: Secondary | ICD-10-CM | POA: Diagnosis not present

## 2023-04-06 DIAGNOSIS — Z0000 Encounter for general adult medical examination without abnormal findings: Secondary | ICD-10-CM | POA: Diagnosis not present

## 2023-04-06 DIAGNOSIS — M810 Age-related osteoporosis without current pathological fracture: Secondary | ICD-10-CM | POA: Diagnosis not present

## 2023-04-06 DIAGNOSIS — E119 Type 2 diabetes mellitus without complications: Secondary | ICD-10-CM | POA: Diagnosis not present

## 2023-04-06 DIAGNOSIS — I7090 Unspecified atherosclerosis: Secondary | ICD-10-CM | POA: Diagnosis not present

## 2023-04-06 DIAGNOSIS — E1136 Type 2 diabetes mellitus with diabetic cataract: Secondary | ICD-10-CM | POA: Diagnosis not present

## 2023-04-06 DIAGNOSIS — E039 Hypothyroidism, unspecified: Secondary | ICD-10-CM | POA: Diagnosis not present

## 2023-04-06 DIAGNOSIS — I10 Essential (primary) hypertension: Secondary | ICD-10-CM | POA: Diagnosis not present

## 2023-06-08 DIAGNOSIS — H35013 Changes in retinal vascular appearance, bilateral: Secondary | ICD-10-CM | POA: Diagnosis not present

## 2023-06-08 DIAGNOSIS — E119 Type 2 diabetes mellitus without complications: Secondary | ICD-10-CM | POA: Diagnosis not present

## 2023-06-08 DIAGNOSIS — H35031 Hypertensive retinopathy, right eye: Secondary | ICD-10-CM | POA: Diagnosis not present

## 2023-06-08 DIAGNOSIS — H524 Presbyopia: Secondary | ICD-10-CM | POA: Diagnosis not present

## 2023-06-08 DIAGNOSIS — H25813 Combined forms of age-related cataract, bilateral: Secondary | ICD-10-CM | POA: Diagnosis not present

## 2023-06-27 ENCOUNTER — Ambulatory Visit: Payer: Medicare PPO | Admitting: Cardiology

## 2023-08-04 NOTE — Progress Notes (Unsigned)
Cardiology Office Note:    Date:  08/11/2023   ID:  [name], DOB [birth year], MRN [id number]  PCP:  Renford Dills, MD   [facility] Providers Cardiologist:  None     Referring MD: Maxie Better, MD   Chief Complaint  Patient presents with   arterial calcification    History of Present Illness:    Virginia Travis is a 67 y.o. female seen at the request of Dr Cherly Hensen for evaluation of abnormal mammogram demonstrating calcification of arteries in her breast. She has a history of HTN, DM type 2, and HLD. She does have a remote history of TIA many years ago in [place]. Apparently had TEE and Holter done then which were OK. She denies any chest pain or dyspnea. No palpitations. Rarely gets some sensation in her chest where she feels her heart beating.   Past Medical History:  Diagnosis Date   Diabetes mellitus    type II   Hyperlipidemia    Hypertension    Osteoporosis    Thyroid disease    hypothyroidism   TIA (transient ischemic attack)     Past Surgical History:  Procedure Laterality Date   arthroscopic knee Left     Current Medications: Current Meds  Medication Sig   aspirin 81 MG EC tablet 1 tablet   Cholecalciferol 25 MCG (1000 UT) capsule Take 1,000 Units by mouth daily.     GEMTESA 75 MG TABS Take 1 tablet by mouth daily.   levothyroxine (SYNTHROID, LEVOTHROID) 88 MCG tablet Take 1 tablet (88 mcg total) by mouth daily.   losartan (COZAAR) 50 MG tablet Take by mouth.   metFORMIN (GLUCOPHAGE) 500 MG tablet Take 500 mg by mouth 2 (two) times daily with meals.     Multiple Vitamin (MULTIVITAMIN ADULT PO) 1 tablet   Multiple Vitamins-Minerals (MULTIVITAMIN WITH MINERALS) tablet Take 1 tablet by mouth daily.     OZEMPIC, 0.25 OR 0.5 MG/DOSE, 2 MG/3ML SOPN SMARTSIG:0.5 Milligram(s) SUB-Q Every 4 Weeks   simvastatin (ZOCOR) 20 MG tablet SMARTSIG:1 Tablet(s) By Mouth Every Evening   SKYRIZI PEN 150 MG/ML pen Subcutaneous for 84 Days      Allergies:   Ramipril, Sulfa antibiotics, and Sulfonamide derivatives   Social History   Socioeconomic History   Marital status: Married    Spouse name: Not on file   Number of children: 2   Years of education: Not on file   Highest education level: Not on file  Occupational History   Occupation: ACADEMIC COACH    Employer: [employer]  Tobacco Use   Smoking status: Never   Smokeless tobacco: Never  Vaping Use   Vaping status: Never Used  Substance and Sexual Activity   Alcohol use: Yes    Alcohol/week: 1.0 standard drink of alcohol    Types: 1 Glasses of wine per week   Drug use: Not on file   Sexual activity: Not on file  Other Topics Concern   Not on file  Social History Narrative   Husband is the Interior and spatial designer of transportation in [place]      Retired Programmer, systems   Social Drivers of Health   Financial Resource Strain: Low Risk  (09/02/2019)   Received from [facility], [facility]   Overall Financial Resource Strain (CARDIA)    Difficulty of Paying Living Expenses: Not hard at all  Food Insecurity: No Food Insecurity (09/02/2019)   Received from [facility], [facility]  States   Hunger Vital Sign    Worried About Programme researcher, broadcasting/film/video in the Last Year: Never true    Ran Out of Food in the Last Year: Never true  Transportation Needs: No Transportation Needs (09/02/2019)   Received from [facility], [facility]   [employer] - Administrator, Civil Service (Medical): No    Lack of Transportation (Non-Medical): No  Physical Activity: Inactive (09/02/2019)   Received from [facility], [facility]   Exercise Vital Sign    Days of Exercise per Week: 0 days    Minutes of Exercise per Session: 0 min  Stress: No Stress Concern Present (09/02/2019)   Received from [facility], [facility]   [employer] of Occupational Health - Occupational Stress Questionnaire    Feeling of Stress : Only a little  Social Connections: Socially Integrated (09/02/2019)   Received from [facility], [facility]   Social Connection and Isolation Panel [NHANES]    Frequency of Communication with Friends and Family: More than three times a week    Frequency of Social Gatherings with Friends and Family: Patient declined    Attends Religious Services: More than 4 times per year    Active Member of [employer] or Organizations: Yes    Attends Banker Meetings: Patient declined    Marital Status: Married     Family History: The patient's family history includes Dementia in her father; Hyperlipidemia in an other family member; Hypertension in an other family member. There is no history of Cancer.  ROS:   Please see the history of present illness.     All other systems reviewed and are negative.  EKGs/Labs/Other Studies Reviewed:    The following studies were reviewed today: EKG Interpretation Date/Time:  Thursday August 11 2023 10:54:35 EST Ventricular Rate:  81 PR Interval:  108 QRS Duration:  80 QT Interval:  364 QTC Calculation: 422 R Axis:   55  Text Interpretation: Sinus rhythm with sinus arrhythmia with short PR No previous ECGs available Confirmed by [place], Brace Welte [phone]) on 08/11/2023 11:05:05 AM   EKG Interpretation Date/Time:  Thursday August 11 2023 10:54:35 EST Ventricular Rate:  81 PR Interval:  108 QRS Duration:  80 QT Interval:  364 QTC Calculation: 422 R Axis:   55  Text Interpretation: Sinus rhythm with sinus arrhythmia with short PR No previous ECGs available Confirmed by [place], Teven Mittman [phone]) on 08/11/2023 11:05:05 AM    Recent Labs: No results found for requested labs within last 365 days.  Recent Lipid Panel    Component  Value Date/Time   CHOL 134 06/12/2010 2019   TRIG 61 06/12/2010 2019   HDL 57 06/12/2010 2019   CHOLHDL 2.4 Ratio 06/12/2010 2019   VLDL 12 06/12/2010 2019   LDLCALC 65 06/12/2010 2019   Dated 04/06/23: A1c 6.3%. cholesterol 134, triglycerides 50, HDL 62, LDL 61. CMET normal. Hgb 11.1.   Risk Assessment/Calculations:                Physical Exam:    VS:  BP 126/68   Pulse 81   Ht 5\' 4"  (1.626 m)   Wt 227 lb (103 kg)   SpO2 95%   BMI 38.96 kg/m     Wt Readings from Last 3 Encounters:  08/11/23 227 lb (103 kg)  09/18/21 243 lb 1.9 oz (110.3 kg)  03/18/21 235 lb (  106.6 kg)     GEN:  Well nourished, overweight, in no acute distress HEENT: Normal NECK: No JVD; No carotid bruits LYMPHATICS: No lymphadenopathy CARDIAC: RRR, no murmurs, rubs, gallops RESPIRATORY:  Clear to auscultation without rales, wheezing or rhonchi  ABDOMEN: Soft, non-tender, non-distended MUSCULOSKELETAL:  No edema; No deformity  SKIN: Warm and dry NEUROLOGIC:  Alert and oriented x 3 PSYCHIATRIC:  Normal affect   ASSESSMENT:    1. Hypertension, unspecified type   2. Type 2 diabetes mellitus without complication, without long-term current use of insulin (HCC)   3. Hypercholesteremia    PLAN:    In order of problems listed above:  Findings of breast artery calcification on mammogram. We discussed this finding. It indicates she does have plaque and this may carry a higher CV risk. She is completely asymptomatic. She is on appropriate therapy for her DM, HTN, and HLD. She is on ASA daily. At this point would just recommend she increase her aerobic activity and lose weight. Given the absence of symptoms I don't feel further testing is indicated. She is instructed to contract me if she does develop chest pain, dyspnea or abnormal fatigue.  DM on metformin and Ozempic. A1c 6.3% HTN controlled on losartan HLD. On simvastatin. LDL 61 at goal           Medication Adjustments/Labs and Tests  Ordered: Current medicines are reviewed at length with the patient today.  Concerns regarding medicines are outlined above.  Orders Placed This Encounter  Procedures   EKG 12-Lead   No orders of the defined types were placed in this encounter.   Patient Instructions  Medication Instructions:  Continue same medications *If you need a refill on your cardiac medications before your next appointment, please call your pharmacy*   Lab Work: None ordered   Testing/Procedures: None ordered   Follow-Up: At [facility], you and your health needs are our priority.  As part of our continuing mission to provide you with exceptional heart care, we have created designated Provider Care Teams.  These Care Teams include your primary Cardiologist (physician) and Advanced Practice Providers (APPs -  Physician Assistants and Nurse Practitioners) who all work together to provide you with the care you need, when you need it.  We recommend signing up for the patient portal called "MyChart".  Sign up information is provided on this After Visit Summary.  MyChart is used to connect with patients for Virtual Visits (Telemedicine).  Patients are able to view lab/test results, encounter notes, upcoming appointments, etc.  Non-urgent messages can be sent to your provider as well.   To learn more about what you can do with MyChart, go to [email or web address].    Your next appointment:  As Needed    Provider:  Dr.Aleli Navedo         Signed, Cordaryl Decelles [place], MD  08/11/2023 11:21 AM    [facility]

## 2023-08-11 ENCOUNTER — Ambulatory Visit: Payer: Medicare PPO | Attending: Cardiology | Admitting: Cardiology

## 2023-08-11 ENCOUNTER — Encounter (INDEPENDENT_AMBULATORY_CARE_PROVIDER_SITE_OTHER): Payer: Self-pay

## 2023-08-11 ENCOUNTER — Encounter: Payer: Self-pay | Admitting: Cardiology

## 2023-08-11 VITALS — BP 126/68 | HR 81 | Ht 64.0 in | Wt 227.0 lb

## 2023-08-11 DIAGNOSIS — E7800 Pure hypercholesterolemia, unspecified: Secondary | ICD-10-CM

## 2023-08-11 DIAGNOSIS — E119 Type 2 diabetes mellitus without complications: Secondary | ICD-10-CM

## 2023-08-11 DIAGNOSIS — I10 Essential (primary) hypertension: Secondary | ICD-10-CM

## 2023-08-11 NOTE — Patient Instructions (Signed)
 Medication Instructions:  Continue same medications *If you need a refill on your cardiac medications before your next appointment, please call your pharmacy*   Lab Work: None ordered   Testing/Procedures: None ordered   Follow-Up: At [facility], you and your health needs are our priority.  As part of our continuing mission to provide you with exceptional heart care, we have created designated Provider Care Teams.  These Care Teams include your primary Cardiologist (physician) and Advanced Practice Providers (APPs -  Physician Assistants and Nurse Practitioners) who all work together to provide you with the care you need, when you need it.  We recommend signing up for the patient portal called "MyChart".  Sign up information is provided on this After Visit Summary.  MyChart is used to connect with patients for Virtual Visits (Telemedicine).  Patients are able to view lab/test results, encounter notes, upcoming appointments, etc.  Non-urgent messages can be sent to your provider as well.   To learn more about what you can do with MyChart, go to [email or web address].    Your next appointment:  As Needed    Provider:  Dr.Jordan

## 2023-09-23 ENCOUNTER — Other Ambulatory Visit (HOSPITAL_COMMUNITY): Payer: Self-pay | Admitting: Cardiology

## 2023-09-23 DIAGNOSIS — Z8249 Family history of ischemic heart disease and other diseases of the circulatory system: Secondary | ICD-10-CM

## 2023-09-28 ENCOUNTER — Inpatient Hospital Stay (HOSPITAL_BASED_OUTPATIENT_CLINIC_OR_DEPARTMENT_OTHER): Admission: RE | Admit: 2023-09-28 | Payer: Medicare PPO | Source: Ambulatory Visit

## 2023-10-12 ENCOUNTER — Other Ambulatory Visit (HOSPITAL_BASED_OUTPATIENT_CLINIC_OR_DEPARTMENT_OTHER): Payer: Self-pay | Admitting: Internal Medicine

## 2023-10-12 DIAGNOSIS — I7090 Unspecified atherosclerosis: Secondary | ICD-10-CM | POA: Diagnosis not present

## 2023-10-12 DIAGNOSIS — Z5181 Encounter for therapeutic drug level monitoring: Secondary | ICD-10-CM | POA: Diagnosis not present

## 2023-10-12 DIAGNOSIS — E1142 Type 2 diabetes mellitus with diabetic polyneuropathy: Secondary | ICD-10-CM | POA: Diagnosis not present

## 2023-10-12 DIAGNOSIS — E7800 Pure hypercholesterolemia, unspecified: Secondary | ICD-10-CM | POA: Diagnosis not present

## 2023-10-12 DIAGNOSIS — I10 Essential (primary) hypertension: Secondary | ICD-10-CM | POA: Diagnosis not present

## 2023-10-12 DIAGNOSIS — E039 Hypothyroidism, unspecified: Secondary | ICD-10-CM | POA: Diagnosis not present

## 2023-10-12 DIAGNOSIS — D75839 Thrombocytosis, unspecified: Secondary | ICD-10-CM | POA: Diagnosis not present

## 2023-10-12 DIAGNOSIS — E1169 Type 2 diabetes mellitus with other specified complication: Secondary | ICD-10-CM | POA: Diagnosis not present

## 2023-10-19 ENCOUNTER — Inpatient Hospital Stay (HOSPITAL_BASED_OUTPATIENT_CLINIC_OR_DEPARTMENT_OTHER): Admission: RE | Admit: 2023-10-19 | Payer: Medicare PPO | Source: Ambulatory Visit

## 2023-10-20 ENCOUNTER — Ambulatory Visit (HOSPITAL_BASED_OUTPATIENT_CLINIC_OR_DEPARTMENT_OTHER)
Admission: RE | Admit: 2023-10-20 | Discharge: 2023-10-20 | Disposition: A | Discharge status: Home / Self Care | Payer: Self-pay | Source: Ambulatory Visit | Attending: Internal Medicine | Admitting: Internal Medicine

## 2023-10-20 DIAGNOSIS — E7800 Pure hypercholesterolemia, unspecified: Secondary | ICD-10-CM | POA: Insufficient documentation

## 2023-11-10 DIAGNOSIS — Z833 Family history of diabetes mellitus: Secondary | ICD-10-CM | POA: Diagnosis not present

## 2023-11-10 DIAGNOSIS — I10 Essential (primary) hypertension: Secondary | ICD-10-CM | POA: Diagnosis not present

## 2023-11-10 DIAGNOSIS — E119 Type 2 diabetes mellitus without complications: Secondary | ICD-10-CM | POA: Diagnosis not present

## 2023-11-10 DIAGNOSIS — N3941 Urge incontinence: Secondary | ICD-10-CM | POA: Diagnosis not present

## 2023-11-10 DIAGNOSIS — Z8249 Family history of ischemic heart disease and other diseases of the circulatory system: Secondary | ICD-10-CM | POA: Diagnosis not present

## 2023-11-10 DIAGNOSIS — E039 Hypothyroidism, unspecified: Secondary | ICD-10-CM | POA: Diagnosis not present

## 2023-11-10 DIAGNOSIS — E785 Hyperlipidemia, unspecified: Secondary | ICD-10-CM | POA: Diagnosis not present

## 2023-11-10 DIAGNOSIS — R600 Localized edema: Secondary | ICD-10-CM | POA: Diagnosis not present

## 2023-12-29 DIAGNOSIS — N3281 Overactive bladder: Secondary | ICD-10-CM | POA: Diagnosis not present

## 2023-12-29 DIAGNOSIS — R350 Frequency of micturition: Secondary | ICD-10-CM | POA: Diagnosis not present

## 2024-01-31 ENCOUNTER — Other Ambulatory Visit (INDEPENDENT_AMBULATORY_CARE_PROVIDER_SITE_OTHER)

## 2024-01-31 ENCOUNTER — Ambulatory Visit: Admitting: Physician Assistant

## 2024-01-31 DIAGNOSIS — M25561 Pain in right knee: Secondary | ICD-10-CM

## 2024-01-31 DIAGNOSIS — M1711 Unilateral primary osteoarthritis, right knee: Secondary | ICD-10-CM

## 2024-01-31 DIAGNOSIS — G8929 Other chronic pain: Secondary | ICD-10-CM | POA: Diagnosis not present

## 2024-01-31 MED ORDER — METHYLPREDNISOLONE ACETATE 40 MG/ML IJ SUSP
40.0000 mg | INTRAMUSCULAR | Status: AC | PRN
  Administered 2024-01-31: 40 mg via INTRA_ARTICULAR

## 2024-01-31 MED ORDER — LIDOCAINE HCL 1 % IJ SOLN
2.0000 mL | INTRAMUSCULAR | Status: AC | PRN
  Administered 2024-01-31: 2 mL

## 2024-01-31 MED ORDER — BUPIVACAINE HCL 0.25 % IJ SOLN
2.0000 mL | INTRAMUSCULAR | Status: AC | PRN
  Administered 2024-01-31: 2 mL via INTRA_ARTICULAR

## 2024-01-31 NOTE — Progress Notes (Signed)
 Office Visit Note   Patient: Virginia Travis           Date of Birth: 02-Feb-1956           MRN: [id number] Visit Date: 01/31/2024              Requested by: Merl Star, MD [address] [place],  [place] [address] PCP: Merl Star, MD   Assessment & Plan: Visit Diagnoses:  1. Unilateral primary osteoarthritis, right knee     Plan: Impression is advanced medial patellofemoral degenerative changes with questionable lateral meniscus pathology.  We have discussed proceeding with cortisone injection as well as a knee OA reaction brace.  She will follow-up with us  as needed.  Call with concerns or questions.  Follow-Up Instructions: Return if symptoms worsen or fail to improve.   Orders:  Orders Placed This Encounter  Procedures   XR KNEE 3 VIEW RIGHT   No orders of the defined types were placed in this encounter.     Procedures: Large Joint Inj: R knee on 01/31/2024 9:31 AM Indications: pain Details: 22 G needle, anterolateral approach Medications: 2 mL lidocaine 1 %; 2 mL bupivacaine 0.25 %; 40 mg methylPREDNISolone acetate 40 MG/ML      Clinical Data: No additional findings.   Subjective: Chief Complaint  Patient presents with   Right Knee - Pain    HPI patient is a pleasant 68 year old female who comes in today with right knee pain.  Symptoms began about a year ago and have progressively worsened.  She denies having had any injury or change in activity leading up to the pain.  All of her pain is to the lateral aspect.  Symptoms are intermittent but worse with stair climbing, squatting and walking.  She does note instability at times.  She has been taking ibuprofen or Aleve with some relief.  No previous cortisone injection to the right knee.  Review of Systems as detailed in HPI.  All others reviewed and are negative.   Objective: Vital Signs: There were no vitals taken for this visit.  Physical Exam well-developed well-nourished female in  no acute distress.  Alert and oriented x 3.  Ortho Exam right knee exam: Range of motion from 0 to 110 degrees.  Lateral joint line tenderness.  Marked patellofemoral crepitus.  She is neurovascularly intact distally.  Specialty Comments:  No specialty comments available.  Imaging: XR KNEE 3 VIEW RIGHT Result Date: 01/31/2024 X-rays demonstrate advanced medial and patellofemoral degenerative changes.  There is a lucency within the medial tibial plateau    PMFS History: Patient Active Problem List   Diagnosis Date Noted   PARESTHESIA 08/06/2010   PNEUMONIA 10/28/2009   COUGH 10/28/2009   PHARYNGITIS, ACUTE 10/22/2009   ECZEMA 07/14/2009   HYPOTHYROIDISM 05/15/2009   Type 2 diabetes mellitus without complication, without long-term current use of insulin (HCC) 05/15/2009   HYPERLIPIDEMIA 05/15/2009   HYPERTENSION 05/15/2009   OSTEOPOROSIS 05/15/2009   Past Medical History:  Diagnosis Date   Diabetes mellitus    type II   Hyperlipidemia    Hypertension    Osteoporosis    Thyroid disease    hypothyroidism   TIA (transient ischemic attack)     Family History  Problem Relation Age of Onset   Dementia Father    Hyperlipidemia Other    Hypertension Other    Cancer Neg Hx     Past Surgical History:  Procedure Laterality Date   arthroscopic knee Left  Social History   Occupational History   Occupation: ACADEMIC COACH    Employer: [employer]  Tobacco Use   Smoking status: Never   Smokeless tobacco: Never  Vaping Use   Vaping status: Never Used  Substance and Sexual Activity   Alcohol use: Yes    Alcohol/week: 1.0 standard drink of alcohol    Types: 1 Glasses of wine per week   Drug use: Not on file   Sexual activity: Not on file

## 2024-02-01 ENCOUNTER — Telehealth: Payer: Self-pay

## 2024-02-01 NOTE — Telephone Encounter (Signed)
 Patient called stating her knee brace was too big and keeps falling off. She was in the area and wanted to change to a smaller size. Autumn Forrest advised she would help patient upon arrival.

## 2024-02-29 DIAGNOSIS — Z79899 Other long term (current) drug therapy: Secondary | ICD-10-CM | POA: Diagnosis not present

## 2024-02-29 DIAGNOSIS — L409 Psoriasis, unspecified: Secondary | ICD-10-CM | POA: Diagnosis not present

## 2024-03-26 DIAGNOSIS — Z1231 Encounter for screening mammogram for malignant neoplasm of breast: Secondary | ICD-10-CM | POA: Diagnosis not present

## 2024-04-23 ENCOUNTER — Other Ambulatory Visit: Payer: Self-pay | Admitting: Medical Genetics

## 2024-06-01 ENCOUNTER — Other Ambulatory Visit (INDEPENDENT_AMBULATORY_CARE_PROVIDER_SITE_OTHER): Payer: Self-pay

## 2024-06-01 ENCOUNTER — Ambulatory Visit: Admitting: Physician Assistant

## 2024-06-01 ENCOUNTER — Other Ambulatory Visit: Payer: Self-pay | Admitting: Physician Assistant

## 2024-06-01 DIAGNOSIS — M9261 Juvenile osteochondrosis of tarsus, right ankle: Secondary | ICD-10-CM | POA: Diagnosis not present

## 2024-06-01 DIAGNOSIS — M79671 Pain in right foot: Secondary | ICD-10-CM | POA: Diagnosis not present

## 2024-06-01 DIAGNOSIS — M1711 Unilateral primary osteoarthritis, right knee: Secondary | ICD-10-CM

## 2024-06-01 MED ORDER — METHYLPREDNISOLONE ACETATE 40 MG/ML IJ SUSP
40.0000 mg | INTRAMUSCULAR | Status: AC | PRN
  Administered 2024-06-01: 40 mg via INTRA_ARTICULAR

## 2024-06-01 MED ORDER — LIDOCAINE HCL 1 % IJ SOLN
2.0000 mL | INTRAMUSCULAR | Status: AC | PRN
  Administered 2024-06-01: 2 mL

## 2024-06-01 MED ORDER — DICLOFENAC SODIUM 75 MG PO TBEC: 75.0000 mg | DELAYED_RELEASE_TABLET | Freq: Two times a day (BID) | ORAL | 2 refills | Status: AC | PRN

## 2024-06-01 MED ORDER — DICLOFENAC SODIUM 1 % EX GEL: 4.0000 g | Freq: Four times a day (QID) | CUTANEOUS | 2 refills | Status: AC | PRN

## 2024-06-01 MED ORDER — BUPIVACAINE HCL 0.25 % IJ SOLN
2.0000 mL | INTRAMUSCULAR | Status: AC | PRN
  Administered 2024-06-01: 2 mL via INTRA_ARTICULAR

## 2024-06-01 NOTE — Progress Notes (Signed)
 Office Visit Note   Patient: Virginia Travis           Date of Birth: [birth year]           MRN: [id number] Visit Date: 06/01/2024              Requested by: Rexanne Ingle, MD [address] [place],  [place] [address] PCP: Rexanne Ingle, MD   Assessment & Plan: Visit Diagnoses:  1. Haglund's deformity of right heel   2. Unilateral primary osteoarthritis, right knee     Plan: Impression is right knee advanced degenerative joint disease and right heel Achilles tendinitis with underlying Haglund's deformity.  In regards to the knee, we have discussed various treatment options to include cortisone injection versus viscosupplementation injection versus total knee arthroplasty.  Her husband is currently undergoing cancer treatment and so she is not interested in surgery at this time.  She would like to repeat cortisone injection today and get approval for viscosupplementation injection for the near future should her pain return following the cortisone injection.  In regards to the Achilles tendinitis, I have recommended a cam boot, oral and topical NSAIDs.  She will wean out of the cam boot when ready.  She will follow-up with us  as needed for this issue.  Call with concerns or questions.  This patient is diagnosed with osteoarthritis of the knee(s).    Radiographs show evidence of joint space narrowing, osteophytes, subchondral sclerosis and/or subchondral cysts.  This patient has knee pain which interferes with functional and activities of daily living.    This patient has experienced inadequate response, adverse effects and/or intolerance with conservative treatments such as acetaminophen, NSAIDS, topical creams, physical therapy or regular exercise, knee bracing and/or weight loss.   This patient has experienced inadequate response or has a contraindication to intra articular steroid injections for at least 3 months.   This patient is not scheduled to have a total knee  replacement within 6 months of starting treatment with viscosupplementation.   Follow-Up Instructions: Return for once approved for right knee gel inj.   Orders:  Orders Placed This Encounter  Procedures   Large Joint Inj: R knee   XR Os Calcis Right   Meds ordered this encounter  Medications   diclofenac (VOLTAREN) 75 MG EC tablet    Sig: Take 1 tablet (75 mg total) by mouth 2 (two) times daily as needed.    Dispense:  60 tablet    Refill:  2      Procedures: Large Joint Inj: R knee on 06/01/2024 9:41 AM Indications: pain Details: 22 G needle, anterolateral approach Medications: 2 mL lidocaine  1 %; 2 mL bupivacaine  0.25 %; 40 mg methylPREDNISolone  acetate 40 MG/ML      Clinical Data: No additional findings.   Subjective: Chief Complaint  Patient presents with   Right Foot - Pain   Right Knee - Pain    HPI patient is a pleasant 68 year old female who comes in today with right knee and right heel pain.  Regards to the right knee, she has a history of underlying osteoarthritis.  We have seen her for this in the past where she underwent cortisone injection in June of this past year.  She had fairly good relief for about a month.  Symptoms have returned.  Majority of her pain is to the anterior knee.  Symptoms primarily occur when she is getting in and out of a car, pivoting and stair climbing.  She has  been wearing a brace and taking Aleve and Tylenol without significant relief.  No previous viscosupplementation injection.  Other issue she brings up today is right heel pain.  All of her pain is the posterior calcaneus.  Symptoms have been ongoing for 2 years and have progressively worsened.  Pain is worse with standing and walking.  She has not previously worn a cam boot.  Review of Systems as detailed in HPI.  All others reviewed and are negative.   Objective: Vital Signs: There were no vitals taken for this visit.  Physical Exam well-developed well-nourished female in  no acute distress.  Alert and oriented x 3.  Ortho Exam right knee exam: Small effusion.  Range of motion 0 to 110 degrees.  Medial and lateral joint line tenderness.  She does have tenderness over the medial patellar facet.  She is neurovascularly intact distally.  Right heel exam: Moderate tenderness over the Achilles attachment at the calcaneus.  Dorsiflexion to neutral.  She is neurovascularly intact distally.  Specialty Comments:  No specialty comments available.  Imaging: XR Os Calcis Right Result Date: 06/01/2024 Xrays demonstrate osteophyte formation posterior calcaneus    PMFS History: Patient Active Problem List   Diagnosis Date Noted   PARESTHESIA 08/06/2010   PNEUMONIA 10/28/2009   COUGH 10/28/2009   PHARYNGITIS, ACUTE 10/22/2009   ECZEMA 07/14/2009   HYPOTHYROIDISM 05/15/2009   Type 2 diabetes mellitus without complication, without long-term current use of insulin (HCC) 05/15/2009   HYPERLIPIDEMIA 05/15/2009   HYPERTENSION 05/15/2009   OSTEOPOROSIS 05/15/2009   Past Medical History:  Diagnosis Date   Diabetes mellitus    type II   Hyperlipidemia    Hypertension    Osteoporosis    Thyroid disease    hypothyroidism   TIA (transient ischemic attack)     Family History  Problem Relation Age of Onset   Dementia Father    Hyperlipidemia Other    Hypertension Other    Cancer Neg Hx     Past Surgical History:  Procedure Laterality Date   arthroscopic knee Left    Social History   Occupational History   Occupation: ACADEMIC [employer]ACH    Employer: [employer]  Tobacco Use   Smoking status: Never   Smokeless tobacco: Never  Vaping Use   Vaping status: Never Used  Substance and Sexual Activity   Alcohol use: Yes    Alcohol/week: 1.0 standard drink of alcohol    Types: 1 Glasses of wine per week   Drug use: Not on file   Sexual activity: Not on file

## 2024-06-13 DIAGNOSIS — H25813 Combined forms of age-related cataract, bilateral: Secondary | ICD-10-CM | POA: Diagnosis not present

## 2024-06-13 DIAGNOSIS — H524 Presbyopia: Secondary | ICD-10-CM | POA: Diagnosis not present

## 2024-06-13 DIAGNOSIS — E119 Type 2 diabetes mellitus without complications: Secondary | ICD-10-CM | POA: Diagnosis not present

## 2024-06-18 ENCOUNTER — Encounter: Payer: Self-pay | Admitting: Radiology

## 2024-07-16 ENCOUNTER — Other Ambulatory Visit (HOSPITAL_COMMUNITY)
Admission: RE | Admit: 2024-07-16 | Discharge: 2024-07-16 | Disposition: A | Payer: Self-pay | Source: Ambulatory Visit | Attending: Medical Genetics | Admitting: Medical Genetics

## 2024-07-25 DIAGNOSIS — E1142 Type 2 diabetes mellitus with diabetic polyneuropathy: Secondary | ICD-10-CM | POA: Diagnosis not present

## 2024-07-25 DIAGNOSIS — I10 Essential (primary) hypertension: Secondary | ICD-10-CM | POA: Diagnosis not present

## 2024-07-25 DIAGNOSIS — E7800 Pure hypercholesterolemia, unspecified: Secondary | ICD-10-CM | POA: Diagnosis not present

## 2024-07-25 DIAGNOSIS — Z5181 Encounter for therapeutic drug level monitoring: Secondary | ICD-10-CM | POA: Diagnosis not present

## 2024-07-25 DIAGNOSIS — E1169 Type 2 diabetes mellitus with other specified complication: Secondary | ICD-10-CM | POA: Diagnosis not present

## 2024-07-25 LAB — GENECONNECT MOLECULAR SCREEN: Genetic Analysis Overall Interpretation: NEGATIVE
